# Patient Record
Sex: Female | Born: 1985 | Race: White | Hispanic: No | Marital: Married | Smoking: Never smoker
Health system: Southern US, Community
[De-identification: ages and names within clinical notes are randomized; demographics above are authoritative.]

## PROBLEM LIST (undated history)

## (undated) DIAGNOSIS — K811 Chronic cholecystitis: Secondary | ICD-10-CM

## (undated) DIAGNOSIS — R51 Headache: Secondary | ICD-10-CM

## (undated) DIAGNOSIS — K219 Gastro-esophageal reflux disease without esophagitis: Secondary | ICD-10-CM

## (undated) DIAGNOSIS — Z789 Other specified health status: Secondary | ICD-10-CM

## (undated) DIAGNOSIS — Z8489 Family history of other specified conditions: Secondary | ICD-10-CM

## (undated) DIAGNOSIS — R112 Nausea with vomiting, unspecified: Secondary | ICD-10-CM

## (undated) DIAGNOSIS — R519 Headache, unspecified: Secondary | ICD-10-CM

## (undated) DIAGNOSIS — Z87442 Personal history of urinary calculi: Secondary | ICD-10-CM

## (undated) DIAGNOSIS — K8020 Calculus of gallbladder without cholecystitis without obstruction: Secondary | ICD-10-CM

## (undated) DIAGNOSIS — Z9889 Other specified postprocedural states: Secondary | ICD-10-CM

## (undated) HISTORY — PX: WISDOM TOOTH EXTRACTION: SHX21

## (undated) HISTORY — PX: NO PAST SURGERIES: SHX2092

## (undated) HISTORY — PX: UPPER GI ENDOSCOPY: SHX6162

---

## 2016-02-12 LAB — OB RESULTS CONSOLE ANTIBODY SCREEN: ANTIBODY SCREEN: NEGATIVE

## 2016-02-12 LAB — OB RESULTS CONSOLE HEPATITIS B SURFACE ANTIGEN: Hepatitis B Surface Ag: NEGATIVE

## 2016-02-12 LAB — OB RESULTS CONSOLE ABO/RH: RH Type: POSITIVE

## 2016-02-12 LAB — OB RESULTS CONSOLE GC/CHLAMYDIA
CHLAMYDIA, DNA PROBE: NEGATIVE
GC PROBE AMP, GENITAL: NEGATIVE

## 2016-02-12 LAB — OB RESULTS CONSOLE RUBELLA ANTIBODY, IGM: Rubella: IMMUNE

## 2016-02-12 LAB — OB RESULTS CONSOLE RPR
RPR: REACTIVE
RPR: REACTIVE

## 2016-02-12 LAB — OB RESULTS CONSOLE HIV ANTIBODY (ROUTINE TESTING): HIV: NONREACTIVE

## 2016-08-02 NOTE — L&D Delivery Note (Signed)
Delivery Note At 6:15 PM a viable female was delivered via Vaginal, Spontaneous Delivery (Presentation: vtx; ROA ).  APGAR: 9, 9; weight pending.   Placenta status: spontaneous, intact.  Cord:  with the following complications: nuchal x 1 reduced.   Anesthesia:  Epidural Episiotomy: None Lacerations: 2nd degree Suture Repair: 3.0 vicryl rapide Est. Blood Loss (mL): 200  Mom to postpartum.  Baby to Couplet care / Skin to Skin.  Nalleli Largent D 09/05/2016, 6:32 PM

## 2016-08-05 LAB — OB RESULTS CONSOLE GBS
STREP GROUP B AG: NEGATIVE
STREP GROUP B AG: NEGATIVE

## 2016-09-05 ENCOUNTER — Inpatient Hospital Stay (HOSPITAL_COMMUNITY): Payer: Managed Care, Other (non HMO) | Admitting: Anesthesiology

## 2016-09-05 ENCOUNTER — Inpatient Hospital Stay (HOSPITAL_COMMUNITY)
Admission: AD | Admit: 2016-09-05 | Discharge: 2016-09-07 | DRG: 775 | Disposition: A | Discharge status: Home / Self Care | Payer: Managed Care, Other (non HMO) | Source: Ambulatory Visit | Attending: Obstetrics and Gynecology | Admitting: Obstetrics and Gynecology

## 2016-09-05 ENCOUNTER — Encounter (HOSPITAL_COMMUNITY): Payer: Self-pay

## 2016-09-05 DIAGNOSIS — Z6830 Body mass index (BMI) 30.0-30.9, adult: Secondary | ICD-10-CM

## 2016-09-05 DIAGNOSIS — O99214 Obesity complicating childbirth: Secondary | ICD-10-CM | POA: Diagnosis present

## 2016-09-05 DIAGNOSIS — Z3A39 39 weeks gestation of pregnancy: Secondary | ICD-10-CM | POA: Diagnosis not present

## 2016-09-05 DIAGNOSIS — E669 Obesity, unspecified: Secondary | ICD-10-CM | POA: Diagnosis present

## 2016-09-05 DIAGNOSIS — Z3493 Encounter for supervision of normal pregnancy, unspecified, third trimester: Secondary | ICD-10-CM

## 2016-09-05 HISTORY — DX: Other specified health status: Z78.9

## 2016-09-05 LAB — CBC
HCT: 40.6 % (ref 36.0–46.0)
HEMOGLOBIN: 14.2 g/dL (ref 12.0–15.0)
MCH: 31.8 pg (ref 26.0–34.0)
MCHC: 35 g/dL (ref 30.0–36.0)
MCV: 91 fL (ref 78.0–100.0)
PLATELETS: 230 10*3/uL (ref 150–400)
RBC: 4.46 MIL/uL (ref 3.87–5.11)
RDW: 14.1 % (ref 11.5–15.5)
WBC: 9.6 10*3/uL (ref 4.0–10.5)

## 2016-09-05 LAB — TYPE AND SCREEN
ABO/RH(D): A POS
ANTIBODY SCREEN: NEGATIVE

## 2016-09-05 LAB — ABO/RH: ABO/RH(D): A POS

## 2016-09-05 MED ORDER — FENTANYL 2.5 MCG/ML BUPIVACAINE 1/10 % EPIDURAL INFUSION (WH - ANES)
14.0000 mL/h | INTRAMUSCULAR | Status: DC | PRN
  Administered 2016-09-05: 14 mL/h via EPIDURAL
  Filled 2016-09-05: qty 100

## 2016-09-05 MED ORDER — OXYCODONE-ACETAMINOPHEN 5-325 MG PO TABS: 2.0000 | ORAL_TABLET | ORAL | Status: DC | PRN

## 2016-09-05 MED ORDER — OXYCODONE-ACETAMINOPHEN 5-325 MG PO TABS: 1.0000 | ORAL_TABLET | ORAL | Status: DC | PRN

## 2016-09-05 MED ORDER — PHENYLEPHRINE 40 MCG/ML (10ML) SYRINGE FOR IV PUSH (FOR BLOOD PRESSURE SUPPORT)
80.0000 ug | PREFILLED_SYRINGE | INTRAVENOUS | Status: DC | PRN
  Filled 2016-09-05: qty 5
  Filled 2016-09-05: qty 10

## 2016-09-05 MED ORDER — SENNOSIDES-DOCUSATE SODIUM 8.6-50 MG PO TABS
2.0000 | ORAL_TABLET | ORAL | Status: DC
  Administered 2016-09-05 – 2016-09-07 (×2): 2 via ORAL
  Filled 2016-09-05 (×2): qty 2

## 2016-09-05 MED ORDER — SIMETHICONE 80 MG PO CHEW: 80.0000 mg | CHEWABLE_TABLET | ORAL | Status: DC | PRN

## 2016-09-05 MED ORDER — LACTATED RINGERS IV SOLN: 500.0000 mL | Freq: Once | INTRAVENOUS | Status: DC

## 2016-09-05 MED ORDER — COCONUT OIL OIL
1.0000 "application " | TOPICAL_OIL | Status: DC | PRN
  Filled 2016-09-05: qty 120

## 2016-09-05 MED ORDER — METHYLERGONOVINE MALEATE 0.2 MG PO TABS: 0.2000 mg | ORAL_TABLET | ORAL | Status: DC | PRN

## 2016-09-05 MED ORDER — DIPHENHYDRAMINE HCL 25 MG PO CAPS: 25.0000 mg | ORAL_CAPSULE | Freq: Four times a day (QID) | ORAL | Status: DC | PRN

## 2016-09-05 MED ORDER — LIDOCAINE HCL (PF) 1 % IJ SOLN
INTRAMUSCULAR | Status: DC | PRN
  Administered 2016-09-05: 3 mL via EPIDURAL
  Administered 2016-09-05: 5 mL via EPIDURAL
  Administered 2016-09-05: 2 mL via EPIDURAL

## 2016-09-05 MED ORDER — OXYTOCIN BOLUS FROM INFUSION
500.0000 mL | Freq: Once | INTRAVENOUS | Status: AC
  Administered 2016-09-05: 500 mL via INTRAVENOUS

## 2016-09-05 MED ORDER — ACETAMINOPHEN 325 MG PO TABS
650.0000 mg | ORAL_TABLET | ORAL | Status: DC | PRN
  Administered 2016-09-05 – 2016-09-07 (×7): 650 mg via ORAL
  Filled 2016-09-05 (×7): qty 2

## 2016-09-05 MED ORDER — MEASLES, MUMPS & RUBELLA VAC ~~LOC~~ INJ
0.5000 mL | INJECTION | Freq: Once | SUBCUTANEOUS | Status: DC
  Filled 2016-09-05: qty 0.5

## 2016-09-05 MED ORDER — ZOLPIDEM TARTRATE 5 MG PO TABS: 5.0000 mg | ORAL_TABLET | Freq: Every evening | ORAL | Status: DC | PRN

## 2016-09-05 MED ORDER — TETANUS-DIPHTH-ACELL PERTUSSIS 5-2.5-18.5 LF-MCG/0.5 IM SUSP: 0.5000 mL | Freq: Once | INTRAMUSCULAR | Status: DC

## 2016-09-05 MED ORDER — LIDOCAINE HCL (PF) 1 % IJ SOLN
30.0000 mL | INTRAMUSCULAR | Status: DC | PRN
  Filled 2016-09-05: qty 30

## 2016-09-05 MED ORDER — EPHEDRINE 5 MG/ML INJ
10.0000 mg | INTRAVENOUS | Status: DC | PRN
  Filled 2016-09-05: qty 4

## 2016-09-05 MED ORDER — BENZOCAINE-MENTHOL 20-0.5 % EX AERO
1.0000 "application " | INHALATION_SPRAY | CUTANEOUS | Status: DC | PRN
  Administered 2016-09-05: 1 via TOPICAL
  Filled 2016-09-05: qty 56

## 2016-09-05 MED ORDER — ONDANSETRON HCL 4 MG/2ML IJ SOLN: 4.0000 mg | INTRAMUSCULAR | Status: DC | PRN

## 2016-09-05 MED ORDER — OXYCODONE HCL 5 MG PO TABS: 5.0000 mg | ORAL_TABLET | ORAL | Status: DC | PRN

## 2016-09-05 MED ORDER — IBUPROFEN 600 MG PO TABS: 600.0000 mg | ORAL_TABLET | Freq: Four times a day (QID) | ORAL | Status: DC

## 2016-09-05 MED ORDER — PRENATAL MULTIVITAMIN CH
1.0000 | ORAL_TABLET | Freq: Every day | ORAL | Status: DC
  Administered 2016-09-06 – 2016-09-07 (×2): 1 via ORAL
  Filled 2016-09-05 (×2): qty 1

## 2016-09-05 MED ORDER — SOD CITRATE-CITRIC ACID 500-334 MG/5ML PO SOLN: 30.0000 mL | ORAL | Status: DC | PRN

## 2016-09-05 MED ORDER — ONDANSETRON HCL 4 MG PO TABS: 4.0000 mg | ORAL_TABLET | ORAL | Status: DC | PRN

## 2016-09-05 MED ORDER — METHYLERGONOVINE MALEATE 0.2 MG/ML IJ SOLN: 0.2000 mg | INTRAMUSCULAR | Status: DC | PRN

## 2016-09-05 MED ORDER — ONDANSETRON HCL 4 MG/2ML IJ SOLN: 4.0000 mg | Freq: Four times a day (QID) | INTRAMUSCULAR | Status: DC | PRN

## 2016-09-05 MED ORDER — BUTORPHANOL TARTRATE 1 MG/ML IJ SOLN: 1.0000 mg | INTRAMUSCULAR | Status: DC | PRN

## 2016-09-05 MED ORDER — DIBUCAINE 1 % RE OINT
1.0000 "application " | TOPICAL_OINTMENT | RECTAL | Status: DC | PRN
  Filled 2016-09-05: qty 28

## 2016-09-05 MED ORDER — LACTATED RINGERS IV SOLN
INTRAVENOUS | Status: DC
  Administered 2016-09-05: 16:00:00 via INTRAVENOUS

## 2016-09-05 MED ORDER — OXYCODONE HCL 5 MG PO TABS: 10.0000 mg | ORAL_TABLET | ORAL | Status: DC | PRN

## 2016-09-05 MED ORDER — WITCH HAZEL-GLYCERIN EX PADS: 1.0000 "application " | MEDICATED_PAD | CUTANEOUS | Status: DC | PRN

## 2016-09-05 MED ORDER — DIPHENHYDRAMINE HCL 50 MG/ML IJ SOLN: 12.5000 mg | INTRAMUSCULAR | Status: DC | PRN

## 2016-09-05 MED ORDER — MAGNESIUM HYDROXIDE 400 MG/5ML PO SUSP: 30.0000 mL | ORAL | Status: DC | PRN

## 2016-09-05 MED ORDER — OXYTOCIN 40 UNITS IN LACTATED RINGERS INFUSION - SIMPLE MED
2.5000 [IU]/h | INTRAVENOUS | Status: DC
  Filled 2016-09-05: qty 1000

## 2016-09-05 MED ORDER — ACETAMINOPHEN 325 MG PO TABS: 650.0000 mg | ORAL_TABLET | ORAL | Status: DC | PRN

## 2016-09-05 MED ORDER — LACTATED RINGERS IV SOLN: 500.0000 mL | INTRAVENOUS | Status: DC | PRN

## 2016-09-05 NOTE — MAU Note (Signed)
Pt C/O uc's since yesterday, membranes were stripped on Friday, SVE 2 cm's.  Contractions have become more regular since 1200 today, small amount of bleeding, denies LOF.

## 2016-09-05 NOTE — Anesthesia Preprocedure Evaluation (Signed)
Anesthesia Evaluation  Patient identified by MRN, date of birth, ID band Patient awake    Reviewed: Allergy & Precautions, NPO status , Patient's Chart, lab work & pertinent test results  Airway Mallampati: II  TM Distance: >3 FB Neck ROM: Full    Dental  (+) Teeth Intact, Dental Advisory Given   Pulmonary neg pulmonary ROS,    Pulmonary exam normal breath sounds clear to auscultation       Cardiovascular negative cardio ROS Normal cardiovascular exam Rhythm:Regular Rate:Normal     Neuro/Psych negative neurological ROS     GI/Hepatic negative GI ROS, Neg liver ROS,   Endo/Other  Obesity   Renal/GU negative Renal ROS     Musculoskeletal negative musculoskeletal ROS (+)   Abdominal   Peds  Hematology negative hematology ROS (+) Plt 230k   Anesthesia Other Findings Day of surgery medications reviewed with the patient.  Reproductive/Obstetrics (+) Pregnancy                             Anesthesia Physical Anesthesia Plan  ASA: II  Anesthesia Plan: Epidural   Post-op Pain Management:    Induction:   Airway Management Planned:   Additional Equipment:   Intra-op Plan:   Post-operative Plan:   Informed Consent: I have reviewed the patients History and Physical, chart, labs and discussed the procedure including the risks, benefits and alternatives for the proposed anesthesia with the patient or authorized representative who has indicated his/her understanding and acceptance.   Dental advisory given  Plan Discussed with:   Anesthesia Plan Comments: (Patient identified. Risks/Benefits/Options discussed with patient including but not limited to bleeding, infection, nerve damage, paralysis, failed block, incomplete pain control, headache, blood pressure changes, nausea, vomiting, reactions to medication both or allergic, itching and postpartum back pain. Confirmed with bedside nurse the  patient's most recent platelet count. Confirmed with patient that they are not currently taking any anticoagulation, have any bleeding history or any family history of bleeding disorders. Patient expressed understanding and wished to proceed. All questions were answered. )        Anesthesia Quick Evaluation

## 2016-09-05 NOTE — H&P (Signed)
Margaret Powell is a 31 y.o. female, G4 P1021, EGA 39+ weeks with EDC 2-6 presenting for ctx.  Eval in MAU with reg ctx, VE 4 cm and baby low.  Prenatal care essentially uncomplicated, normal fetal ECHO since last child had VSD.  OB History    Gravida Para Term Preterm AB Living   4 1     2 1    SAB TAB Ectopic Multiple Live Births   2       1     Past Medical History:  Diagnosis Date  . Medical history non-contributory    Past Surgical History:  Procedure Laterality Date  . NO PAST SURGERIES     Family History: family history is not on file. Social History:  reports that she has never smoked. She has never used smokeless tobacco. She reports that she does not drink alcohol or use drugs.     Maternal Diabetes: No Genetic Screening: Declined Maternal Ultrasounds/Referrals: Normal Fetal Ultrasounds or other Referrals:  Fetal echo Maternal Substance Abuse:  No Significant Maternal Medications:  None Significant Maternal Lab Results:  Lab values include: Group B Strep negative Other Comments:  None  Review of Systems  Respiratory: Negative.   Cardiovascular: Negative.    Maternal Medical History:  Reason for admission: Contractions.   Contractions: Perceived severity is moderate.    Fetal activity: Perceived fetal activity is normal.    Prenatal complications: no prenatal complications  VE-Rim/C/+1, vtx, AROM clear Dilation: 5 Effacement (%): 80 Station: -1 Exam by:: k fields, rn Blood pressure 118/62, pulse 76, temperature 97.9 F (36.6 C), temperature source Oral, resp. rate 18, height 5\' 4"  (1.626 m), weight 79.4 kg (175 lb), SpO2 100 %. Maternal Exam:  Uterine Assessment: Contraction strength is moderate.  Contraction frequency is regular.   Abdomen: Patient reports no abdominal tenderness. Estimated fetal weight is 7 1/2 lbs.   Fetal presentation: vertex  Introitus: Normal vulva. Normal vagina.  Amniotic fluid character: clear.  Pelvis: adequate for  delivery.   Cervix: Cervix evaluated by digital exam.     Fetal Exam Fetal Monitor Review: Mode: ultrasound.   Baseline rate: 120.  Variability: moderate (6-25 bpm).   Pattern: accelerations present and no decelerations.    Fetal State Assessment: Category I - tracings are normal.     Physical Exam  Vitals reviewed. Constitutional: She appears well-developed and well-nourished.  Cardiovascular: Normal rate and regular rhythm.   Respiratory: Effort normal. No respiratory distress.  GI: Soft.    Prenatal labs: ABO, Rh: --/--/A POS (02/04 1556) Antibody: NEG (02/04 1556) Rubella: Immune (07/13 0000) RPR: Reactive, Reactive (07/13 0000)  HBsAg: Negative (07/13 0000)  HIV: Non-reactive (07/13 0000)  GBS: Negative, Negative (01/04 0000)   Assessment/Plan: IUP at 39+ weeks in active labor.  Anticipate SVD.   Margaret Powell D 09/05/2016, 5:57 PM

## 2016-09-05 NOTE — Anesthesia Procedure Notes (Signed)
Epidural Patient location during procedure: OB Start time: 09/05/2016 4:36 PM End time: 09/05/2016 4:41 PM  Staffing Anesthesiologist: Cecile HearingURK, Toshiyuki Fredell EDWARD Performed: anesthesiologist   Preanesthetic Checklist Completed: patient identified, pre-op evaluation, timeout performed, IV checked, risks and benefits discussed and monitors and equipment checked  Epidural Patient position: sitting Prep: DuraPrep Patient monitoring: blood pressure and continuous pulse ox Approach: midline Location: L3-L4 Injection technique: LOR air  Needle:  Needle type: Tuohy  Needle gauge: 17 G Needle length: 9 cm Needle insertion depth: 4 cm Catheter size: 19 Gauge Catheter at skin depth: 9 cm Test dose: negative and Other (1% Lidocaine)  Additional Notes Patient identified.  Risk benefits discussed including failed block, incomplete pain control, headache, nerve damage, paralysis, blood pressure changes, nausea, vomiting, reactions to medication both toxic or allergic, and postpartum back pain.  Patient expressed understanding and wished to proceed.  All questions were answered.  Sterile technique used throughout procedure and epidural site dressed with sterile barrier dressing. No paresthesia or other complications noted. The patient did not experience any signs of intravascular injection such as tinnitus or metallic taste in mouth nor signs of intrathecal spread such as rapid motor block. Please see nursing notes for vital signs. Reason for block:procedure for pain

## 2016-09-06 NOTE — Lactation Note (Signed)
This note was copied from a baby's chart. Lactation Consultation Note  Patient Name: Margaret Powell ZOXWR'UToday's Date: 09/06/2016 Reason for consult: Follow-up assessment  Asked to assist with a feeding.  Baby 16 hrs old.  Mom states baby had just fed for 30 minutes.  Baby in crib swaddled and cueing.  Offered to assist with positioning and latch on 2nd breast.  Baby positioned in football hold.  Teaching done.  Mom has large, heavy breasts, with large diameter nipples.  Hand expression reviewed.  Colostrum expressed easily.  Baby latched onto areola after a couple attempts, and checked lower lip.  Showed FOB how to assess lower lip, and tug on chin to uncurl lip for better seal.  Lots of basics reviewed.  Mom stated the latch felt comfortable.  A few swallows identified.   Encouraged STS and cue based feedings.  Discussed cluster feedings.  Encouraged to ask for assistance as needed.     Consult Status Consult Status: Follow-up Date: 09/07/16 Follow-up type: In-patient    Judee ClaraSmith, Braydan Marriott E 09/06/2016, 10:29 AM

## 2016-09-06 NOTE — Lactation Note (Signed)
This note was copied from a baby's chart. Lactation Consultation Note Mom's 2nd child. Didn't BF her first child. Stated couldn't latch. Mom has large pendulum breast. Encouraged to elevate w/cloth for breast support. Mom has large everted nipple. They are purplish/dark colored. Mom stated they are usually that color.  Sitting mom upright to assist in latching. Baby started spitting up large emesis. Discussed positions, feeding habits of newborns, cluster feeding, I&O. Mom encouraged to feed baby 8-12 times/24 hours and with feeding cues. Encouraged mom to call for latch assistance if needed.  WH/LC brochure given w/resources, support groups and LC services. Patient Name: Margaret Powell ZOXWR'UToday's Date: 09/06/2016 Reason for consult: Initial assessment   Maternal Data Has patient been taught Hand Expression?: Yes Does the patient have breastfeeding experience prior to this delivery?: No  Feeding Feeding Type: Breast Fed  LATCH Score/Interventions       Type of Nipple: Everted at rest and after stimulation  Comfort (Breast/Nipple): Soft / non-tender     Intervention(s): Breastfeeding basics reviewed;Support Pillows;Position options;Skin to skin     Lactation Tools Discussed/Used     Consult Status Consult Status: Follow-up Date: 09/06/16 Follow-up type: In-patient    [name], Diamond NickelLAURA G 09/06/2016, 7:15 AM

## 2016-09-06 NOTE — Progress Notes (Signed)
Post Partum Day 1  no complaints, up ad lib and tolerating PO , baby spitting a lot Objective: Blood pressure 113/77, pulse 82, temperature 98 F (36.7 C), temperature source Oral, resp. rate 16, height 5\' 4"  (1.626 m), weight 79.4 kg (175 lb), SpO2 100 %, unknown if currently breastfeeding.  Physical Exam:  General: alert and cooperative Lochia: appropriate Uterine Fundus: firm    Recent Labs  09/05/16 1556  HGB 14.2  HCT 40.6    Assessment/Plan: Plan for discharge tomorrow   LOS: 1 day   Arianny Pun W 09/06/2016, 9:44 AM

## 2016-09-06 NOTE — Lactation Note (Signed)
This note was copied from a baby's chart. Lactation Consultation Note  Patient Name: Girl Inocencio HomesLissette Tencza WUJWJ'XToday's Date: 09/06/2016 Reason for consult: Follow-up assessment Baby at 26 hr of life. Mom is reporting bilateral nipple pain. She has large compressible breast with large nipples. The R nipple looks almost bifurcated with a bruised surface and the areola is red. Given comfort gels. Had mom reposition the baby so that the baby was at nipple level. Mom waited for a wide open mouth and lathed baby deeply. Reminded mom to relax when she was bf. She reported this bf felt better than the others. Reviewed baby behavior, nipple care, and breast changes. Parents are aware of lactation services and will call as needed.   Maternal Data    Feeding Feeding Type: Breast Fed Length of feed: 30 min  LATCH Score/Interventions Latch: Grasps breast easily, tongue down, lips flanged, rhythmical sucking. Intervention(s): Adjust position;Breast compression  Audible Swallowing: A few with stimulation Intervention(s): Skin to skin;Hand expression;Alternate breast massage  Type of Nipple: Everted at rest and after stimulation  Comfort (Breast/Nipple): Filling, red/small blisters or bruises, mild/mod discomfort  Problem noted: Mild/Moderate discomfort;Cracked, bleeding, blisters, bruises Interventions  (Cracked/bleeding/bruising/blister): Expressed breast milk to nipple Interventions (Mild/moderate discomfort): Comfort gels (coconut oil)  Hold (Positioning): Assistance needed to correctly position infant at breast and maintain latch. Intervention(s): Position options;Support Pillows  LATCH Score: 7  Lactation Tools Discussed/Used     Consult Status Consult Status: Follow-up Date: 09/07/16 Follow-up type: In-patient    Rulon Eisenmengerlizabeth E Chennel Olivos 09/06/2016, 9:12 PM

## 2016-09-06 NOTE — Anesthesia Postprocedure Evaluation (Signed)
Anesthesia Post Note  Patient: Margaret Powell  Procedure(s) Performed: * No procedures listed *  Patient location during evaluation: Mother Baby Anesthesia Type: Epidural Level of consciousness: awake Pain management: satisfactory to patient Vital Signs Assessment: post-procedure vital signs reviewed and stable Respiratory status: spontaneous breathing Cardiovascular status: stable Anesthetic complications: no        Last Vitals:  Vitals:   09/06/16 0130 09/06/16 0500  BP: 109/68 113/77  Pulse: 82 82  Resp: 16 16  Temp: 36.8 C 36.7 C    Last Pain:  Vitals:   09/06/16 0542  TempSrc:   PainSc: 2    Pain Goal: Patients Stated Pain Goal: 7 (09/05/16 1554)               Cephus ShellingBURGER,Oluchi Pucci

## 2016-09-07 LAB — RPR: RPR: REACTIVE — AB

## 2016-09-07 LAB — RPR, QUANT+TP ABS (REFLEX)
Rapid Plasma Reagin, Quant: 1:8 {titer} — ABNORMAL HIGH
TREPONEMA PALLIDUM AB: NEGATIVE

## 2016-09-07 MED ORDER — PRENATAL 27-0.8 MG PO TABS: 1.0000 | ORAL_TABLET | Freq: Every day | ORAL | 3 refills | Status: DC

## 2016-09-07 NOTE — Plan of Care (Signed)
Problem: Health Behavior/Discharge Planning: Goal: Ability to manage health-related needs will improve Outcome: Completed/Met Date Met: 09/07/16 Discharge education and paperwork discussed.  Reasons to call the MD reviewed.  Pt verbalizes understanding.

## 2016-09-07 NOTE — Progress Notes (Addendum)
Post Partum Day 2 Subjective: no complaints, up ad lib, voiding, tolerating PO and nl lochia, pain controlled  Objective: Blood pressure 122/79, pulse 62, temperature 97.9 F (36.6 C), temperature source Oral, resp. rate 17, height 5\' 4"  (1.626 m), weight 79.4 kg (175 lb), SpO2 100 %, unknown if currently breastfeeding.  Physical Exam:  General: alert and no distress Lochia: appropriate Uterine Fundus: firm   Recent Labs  09/05/16 1556  HGB 14.2  HCT 40.6    Assessment/Plan: Plan for discharge tomorrow, Breastfeeding and Lactation consult.  D/C home with PNV.  F/u 6 weeks.     LOS: 2 days   Bovard-Stuckert, Anhthu Perdew 09/07/2016, 8:44 AM

## 2016-09-07 NOTE — Discharge Summary (Signed)
    OB Discharge Summary     Patient Name: Margaret Powell DOB: 1986/07/24 MRN: [id number]  Date of admission: 09/05/2016 Delivering MD: Jackelyn KnifeMEISINGER, TODD   Date of discharge: 09/07/2016  Admitting diagnosis: 39 WKS, LABOR Intrauterine pregnancy: 3483w5d     Secondary diagnosis:  Active Problems:   Normal pregnancy in third trimester   SVD (spontaneous vaginal delivery)  Additional problems: N/A     Discharge diagnosis: Term Pregnancy Delivered                                                                                                Post partum procedures:N/A  Augmentation: AROM  Complications: None  Hospital course:  Onset of Labor With Vaginal Delivery     31 y.o. yo [id number] at 7783w5d was admitted in Active Labor on 09/05/2016. Patient had an uncomplicated labor course as follows:  Membrane Rupture Time/Date: 5:52 PM ,09/05/2016   Intrapartum Procedures: Episiotomy: None [1]                                         Lacerations:  2nd degree [3]  Patient had a delivery of a Viable infant. 09/05/2016  Information for the patient's newborn:  Aurelio BrashRodriguez, Girl Britnie [[id number]][[id number]]  Delivery Method: Vaginal, Spontaneous Delivery (Filed from Delivery Summary)    Pateint had an uncomplicated postpartum course.  She is ambulating, tolerating a regular diet, passing flatus, and urinating well. Patient is discharged home in stable condition on 09/07/16.   Physical exam  Vitals:   09/06/16 0130 09/06/16 0500 09/06/16 1812 09/07/16 0553  BP: 109/68 113/77 129/77 122/79  Pulse: 82 82 67 62  Resp: 16 16 18 17   Temp: 98.2 F (36.8 C) 98 F (36.7 C) 98.1 F (36.7 C) 97.9 F (36.6 C)  TempSrc: Oral Oral Oral Oral  SpO2:      Weight:      Height:       General: alert and no distress Lochia: appropriate Uterine Fundus: firm  Lab Results  Component Value Date   WBC 9.6 09/05/2016   HGB 14.2 09/05/2016   HCT 40.6 09/05/2016   MCV 91.0 09/05/2016   PLT 230 09/05/2016   No  flowsheet data found.  Discharge instruction: per After Visit Summary and "Baby and Me Booklet".  After visit meds:  Allergies as of 09/07/2016      Reactions   Nsaids Rash   blisters      Medication List    TAKE these medications   multivitamin-prenatal 27-0.8 MG Tabs tablet Take 1 tablet by mouth daily at 12 noon.       Diet: routine diet  Activity: Advance as tolerated. Pelvic rest for 6 weeks.   Outpatient follow up:6 weeks Follow up Appt:No future appointments. Follow up Visit:No Follow-up on file.  Postpartum contraception: Undecided  Newborn Data: Live born female  Birth Weight: 6 lb 9.6 oz (2994 g) APGAR: 9, 9  Baby Feeding: Breast Disposition:home with mother   09/07/2016 Margaret Powell, Margaret Fazzino, MD

## 2016-09-07 NOTE — Lactation Note (Signed)
This note was copied from a baby's chart. Lactation Consultation Note; Mom reports nipples are sore- right one is the worse. Some bruising and blisters noted. Has comfort gels. Baby waking in bassinet. Offered assist with latch and mom agreeable. Mom has been using football hold for every feeding. Suggested using cross cradle hold/ Assisted mom with positioning. Baby took a couple of attempts, I untucked bottom lip and mom reports that feels better. Mom reports breasts are feeling a little heavier this morning. Reviewed engorgement prevention and treatment. Has Medela pump for home. Offered OP appointment and mom plans to make one but wants to make Ped appointment first. She will call for appointment. Reviewed our phone number and [facility] as other resources for support after DC. No further questions at present. To call prn  Patient Name: Margaret Powell HomesLissette Troop ZOXWR'UToday's Date: 09/07/2016 Reason for consult: Follow-up assessment   Maternal Data Formula Feeding for Exclusion: No Has patient been taught Hand Expression?: Yes Does the patient have breastfeeding experience prior to this delivery?: Yes  Feeding Feeding Type: Breast Fed Length of feed: 10 min  LATCH Score/Interventions Latch: Grasps breast easily, tongue down, lips flanged, rhythmical sucking. Intervention(s): Assist with latch;Adjust position;Breast compression  Audible Swallowing: A few with stimulation Intervention(s): Skin to skin;Hand expression  Type of Nipple: Everted at rest and after stimulation  Comfort (Breast/Nipple): Filling, red/small blisters or bruises, mild/mod discomfort  Problem noted: Mild/Moderate discomfort Interventions  (Cracked/bleeding/bruising/blister): Expressed breast milk to nipple Interventions (Mild/moderate discomfort): Comfort gels;Hand expression  Hold (Positioning): Assistance needed to correctly position infant at breast and maintain latch. Intervention(s): Breastfeeding basics reviewed;Support  Pillows;Position options  LATCH Score: 7  Lactation Tools Discussed/Used WIC Program: No   Consult Status Consult Status: Complete    Pamelia HoitWeeks, Marzella Miracle D 09/07/2016, 9:29 AM

## 2016-09-09 ENCOUNTER — Ambulatory Visit (HOSPITAL_COMMUNITY)
Admission: RE | Admit: 2016-09-09 | Discharge: 2016-09-09 | Disposition: A | Discharge status: Home / Self Care | Payer: Managed Care, Other (non HMO) | Source: Ambulatory Visit | Attending: Obstetrics and Gynecology | Admitting: Obstetrics and Gynecology

## 2016-09-09 DIAGNOSIS — Z029 Encounter for administrative examinations, unspecified: Secondary | ICD-10-CM | POA: Diagnosis not present

## 2016-09-09 NOTE — Lactation Note (Signed)
Lactation Consult; Weight today 2772 g 6 # 1.8 oz. Margaret Powell had large brown stool here before weight check. Mom fed her about 45 min before coming to appointment because she was so hungry, Reports she fed a lot through the night. Mom here today because her nipples are very sore. She has large nipples, both pink and some cracking noted on right nipple. Both breasts very full. Reports her milk started coming in yesterday. Baby not very hungry. Did latch with assist and mom reports that feels better after I untucked bottom lip. Reviewed engorgement prevention and treatment. Encouraged ice packs to breasts, frequent nursing and pumping for comfort. She did not bring all of her pump pieces with her.I changed her to # 27 flanges and she reports that feels much better.  Pumped one breast and will go home and pump other breast. Reviewed milk storage. Offered another OP appointment and mom agreeable. Made for Thursday 2/15 at 9 am. Mom very tired, encouraged napping whenever baby naps. Mom going back to work at 12 Margaret Powell and not sure how long she will be able to pump at work. Asking about weaning and changing to formula. Reviewed with parents. No questions at present. To call prn  Mother's reason for visit:  Sore nipples Visit Type:  Feeding assessment  Consult:  Initial Lactation Consultant:  Audry Riles D  ________________________________________________________________________ Margaret Powell Name:  Margaret Powell Date of Birth:  09/05/2016 Pediatrician:  Maisie Fus Gender:  female Gestational Age: [redacted]w[redacted]d (At Birth) Birth Weight:  6 lb 9.6 oz (2994 g) Weight at Discharge:  Weight: 6 lb 3.1 oz (2810 g)               Date of Discharge:  09/07/2016      Filed Weights   09/05/16 1815 09/06/16 0054 09/07/16 0000  Weight: 6 lb 9.6 oz (2994 g) 6 lb 6.7 oz (2910 g) 6 lb 3.1 oz (2810 g)      ________________________________________________________________________  Mother's Name: Margaret Powell Type of delivery:   vag Breastfeeding Experience:  P2 did not breast feed first for long, very sore nipples and changed to formula ________________________________________________________________________  Breastfeeding History (Post Discharge)  Frequency of breastfeeding:  Cluster fed through the night, q 2-3 during the day Duration of feeding:  30 + min    Pumping  Type of pump:  Medela pump in style Frequency:  Twice yesterday Volume:  30 ml  Infant Intake and Output Assessment  Voids:  8+ in 24 hrs.  Color:  Clear yellow had 2 wet and large stool while here for appointment Stools:  7+ in 24 hrs.  Color:  Brown  ________________________________________________________________________  Maternal Breast Assessment  Breast:  Full Nipple:  Erect, Reddened and Cracked  _______________________________________________________________________ Feeding Assessment/Evaluation  Initial feeding assessment:  Infant's oral assessment:  WNL  Positioning:  Football Right breast  LATCH documentation:  Latch:  2 = Grasps breast easily, tongue down, lips flanged, rhythmical sucking.  Audible swallowing:  1 = A few with stimulation  Type of nipple:  2 = Everted at rest and after stimulation  Comfort (Breast/Nipple):  0 = Engorged, cracked, bleeding, large blisters, severe discomfort  Hold (Positioning):  1 = Assistance needed to correctly position infant at breast and maintain latch  LATCH score:  6  Attached assessment:  Deep  Lips flanged:  Yes.    Lips untucked:  Yes.    Suck assessment:  Nonnutritive   Pre-feed weight:  2772 g  6 # 1.8 oz Post-feed  weight:  2782 g  6# 2.1 oz Amount transferred:  10 ml Amount supplemented:  0 ml    Total amount pumped post feed:  R 15 ml  Mom only brought once setup with her, will pump other breast when she gets home   Total amount transferred:  10 ml Total supplement given:  0 ml

## 2016-09-16 ENCOUNTER — Ambulatory Visit (HOSPITAL_COMMUNITY)
Admission: RE | Admit: 2016-09-16 | Discharge: 2016-09-16 | Disposition: A | Discharge status: Home / Self Care | Payer: Managed Care, Other (non HMO) | Source: Ambulatory Visit | Attending: Obstetrics and Gynecology | Admitting: Obstetrics and Gynecology

## 2016-09-16 ENCOUNTER — Ambulatory Visit (HOSPITAL_COMMUNITY): Payer: Managed Care, Other (non HMO)

## 2016-09-16 DIAGNOSIS — Z029 Encounter for administrative examinations, unspecified: Secondary | ICD-10-CM | POA: Diagnosis not present

## 2016-09-16 NOTE — Lactation Note (Signed)
Lactation Consult - baby girl Margaret Powell , and mom Margaret Powell and grandmother present at [facility] consult for 9 am.  Baby awake and hungry, weight  With clean diaper - 6-6.4 oz , increased gain since this past Tuesday 2/13 at [facility] office  Per mom latches have been painful , even after the O/P appt last week  Baby has been feeding about every 2 hours 20 mins each breast . Initially the pain is at an 8 and once the baby starts swallowing improves to 5 intermittently, not the whole feeding.  Wet diapers - >6 , stools >2-3 yellow in 24 hours  DEBP Medela ( stopped pumping since the last consult - baby seemed to be latching better and the weight was going up. Per mom called Dr. Newt Minion for prescription for "All purpose nipple cream " and the soreness has improved. ( see Breast assessment below )  Please see Lactation Impression , and Lactation plan of Care below.   Mother's reason for visit:  F/U for weight check , and sore nipples  Visit Type:  Feeding assessment  Appointment Notes: F/U visit - SN's, pt confirmed appt.  Consult:  Follow-Up Lactation Consultant:  Margaret Powell  ________________________________________________________________________ Margaret Powell Name:  Margaret Powell Date of Birth:  09/05/2016 Pediatrician:  Dr. Maisie Fus / [facility]  Gender:  female Gestational Age: [redacted]w[redacted]d (At Birth) Birth Weight:  6 lb 9.6 oz (2994 g) Weight at Discharge:  Weight: 6 lb 3.1 oz (2810 g)               Date of Discharge:  09/07/2016      Filed Weights   09/05/16 1815 09/06/16 0054 09/07/16 0000  Weight: 6 lb 9.6 oz (2994 g) 6 lb 6.7 oz (2910 g) 6 lb 3.1 oz (2810 g)  Last weight taken from location outside of [facility]:  2/13 - 6-5 oz     Location:Pediatrician's office Weight today: 6-6.4 oz , 2902 g _____________________________________________________________________  Mother's Name: Margaret Powell Type of delivery:  Vaginal Delivery  Breastfeeding Experience: 2nd baby , 1st baby  breast feeding. ) bottle feed 1st baby  Maternal Medical Conditions:  No risk  Maternal Medications:  PNV , stool softener   ________________________________________________________________________  Breastfeeding History (Post - see above note  Discharge)________________________________________________________________________  Maternal Breast Assessment  Breast:  Full Nipple:  Erect Pain level:  5 - 8 with latching  Pain interventions:  Comfort gels and Expressed breast milk  _______________________________________________________________________ Feeding Assessment/Evaluation  Initial feeding assessment:  Infant's oral assessment:  High palate . Labial frenulum close to gum line , but stretches well with exam and when latched. Baby has a small mouth   Positioning:  Cross cradle Right breast  LATCH documentation:  Latch:  2 = Grasps breast easily, tongue down, lips flanged, rhythmical sucking.  Audible swallowing:  2 = Spontaneous and intermittent  Type of nipple:  2 = Everted at rest and after stimulation  Comfort (Breast/Nipple):  1 = Filling, red/small blisters or bruises, mild/mod discomfort  Hold (Positioning):  1 = Assistance needed to correctly position infant at breast and maintain latch  LATCH score: 8  Attached assessment:  Shallow @ 1st   Lips flanged:  No.  Lips untucked:  Yes.    Suck assessment:  Nutritive  Tools:  Comfort gels ( for after feeding )  Instructed on use and cleaning of tool:  Yes.    Pre-feed weight: 2902 g , 6-6.4 oz  Post-feed weight:  2958  g , 6-8 oz  Amount transferred: 56 ml  Amount supplemented:  None   Additional Feeding Assessment -   Positioning:  Football Left breast  LATCH documentation:  Latch:  2 = Grasps breast easily, tongue down, lips flanged, rhythmical sucking.  Audible swallowing:  2 = Spontaneous and intermittent  Type of nipple:  2 = Everted at rest and after stimulation  Comfort (Breast/Nipple):  1 = Filling,  red/small blisters or bruises, mild/mod discomfort  Hold (Positioning):  1 = Assistance needed to correctly position infant at breast and maintain latch  LATCH score: 8   Attached assessment:  Deep  Lips flanged:  Yes.    Lips untucked:  Yes.    Suck assessment:  Nutritive  Tools:  None needed for latch   Pre-feed weight: 2958 g , 6-8.3 oz  Post-feed weight: 2970 g , 6-8.8 oz  Amount transferred: 12 ml  Amount supplemented:  None needed  Total amount pumped post feed: not needed   Total amount transferred:   68 ml  Total supplement given:  None needed   Lactation Impression:  Baby Latched both breast with improved comfort for mom  Worked on training the baby to open wide and latch with breast compressions.  Per mom the discomfort improved especially on the 2nd breast.  Baby transferred off 68 ml total both breast - excellent for 1082 weeks old , and was satisfied. Mom has down a great job establishing and protecting her milk supply.  Mom is motivated and plans to breast feed when returning back to work.  Lactation Plan pf Care:  Praised mom for her efforts breast feeding  LC recommended mom to [facility] am or Tuesday 11am  Make smart weight check for next Thursday 2/22 or Friday 2/ 23  Continue to freed with feeding cues and at least every 2-3 hours  Days/ evenings 2- 2- 1/2 hours. Nights by 3 hours until steady weight gain  Latching - work with Kandace BlitzGianna to open wide , aim the nipple towards the roof of her mouth and breast compressions until swallows( consistent pattern )  and comfort achieved. Intermittent breast compressions - enhance milk transfer  Alternate comfort gels with nipple cream ( comfort gels x6 days )  Loraine LericheMark your calendar for 4 weeks post partum for when to start extra post pumping 3 x's day both breast 10 -15 mins . Introduce a bottle ( medium base ) once day - pace feeding (LC discussed ). And pump instead to replace baby not  feeding at the breast.

## 2017-07-12 ENCOUNTER — Ambulatory Visit: Payer: Self-pay | Admitting: Surgery

## 2017-07-12 NOTE — H&P (View-Only) (Signed)
Margaret Powell 07/12/2017 12:15 PM Location: [facility] Patient #: [id number] DOB: 06/23/1986 Married / Language: English / Race: White Female  History of Present Illness (Jalesia Loudenslager C. Mita Vallo MD; 07/12/2017 1:01 PM) The patient is a 31 year old female who presents for evaluation of gall stones. Note for "Gall stones": ` ` ` Patient sent for surgical consultation at the request of Bhupinder Multani, NP  Chief Complaint: Abdominal pain and gallstones. Probable need for cholecystectomy  The patient is a pleasant, young female. Her originally from [place]. Delivered her child here in February 2018. She had episode of abdominal pain radiating to her back and shoulder after eating a [place]. It woke her up at night. Felt very nauseated. Vomited.. Her husband had the same meal without any problems. No one else sick in the family. She notes most of her family has had gallbladder problems needing surgery in the past. She was concerned about the attack. Has had persistent bloating and discomfort when eating. This does not seem like heartburn or reflux. Persisting and concerning. Went to urgent care center. Suspicion for gallbladder etiology. Ultrasound confirmed gallstones. Small right renal calculus but nonobstructing. Surgical consultation requested.  No personal nor family history of GI/colon cancer, inflammatory bowel disease, irritable bowel syndrome, allergy such as Celiac Sprue, dietary/dairy problems, colitis, ulcers nor gastritis. No recent sick contacts/gastroenteritis. No travel outside the country. No changes in diet. No dysphagia to solids or liquids. No significant heartburn or reflux. No hematochezia, hematemesis, coffee ground emesis. No evidence of prior gastric/peptic ulceration. No history of hematuria or pyuria. No history of urinary tract infections or pyelonephritis. No gestational diabetes and other issues. She does not smoke. She is not  diabetic. She can walk several miles without difficulty. Occasional constipation but usually moves her bowels every day or so.  (Review of systems as stated in this history (HPI) or in the review of systems. Otherwise all other 12 point ROS are negative) ` ` ` Result Impression IMPRESSION: 1. Cholelithiasis. 2. Small nonobstructing right renal calculi Electronically Signed by: Dennis Clemens Result Narrative TECHNIQUE: Realtime multiplanar grayscale and limited color Doppler ultrasound of the right upper quadrant was performed. COMPARISON: None. INDICATION: epigastric, + murphy's sign FINDINGS: PANCREAS: No focal abnormalities are identified. Visualization is limited. VASCULATURE: No abdominal aortic aneurysm. Visualized IVC is patent. Portal vein is patent with normal flow direction. HEPATOBILIARY: Liver: Normal. Gallbladder: Cholelithiasis. No gallbladder wall thickening or sonographic Murphy's sign. CBD: Normal. No intrahepatic biliary ductal dilatation. RIGHT KIDNEY: Right kidney is of normal size. No hydronephrosis. No suspicious masses. Normal renal echotexture. Small nonobstructing right renal calculi MISC: N./A. Other Result Information Acute Interface, Incoming Rad Results - 06/29/2017 4:29 PM EST TECHNIQUE: Realtime multiplanar grayscale and limited color Doppler ultrasound of the right upper quadrant was performed. COMPARISON: None. INDICATION: epigastric, + murphy's sign FINDINGS: PANCREAS: No focal abnormalities are identified. Visualization is limited. VASCULATURE: No abdominal aortic aneurysm. Visualized IVC is patent. Portal vein is patent with normal flow direction. HEPATOBILIARY: Liver: Normal. Gallbladder: Cholelithiasis. No gallbladder wall thickening or sonographic Murphy's sign. CBD: Normal. No intrahepatic biliary ductal dilatation. RIGHT KIDNEY: Right kidney is of normal size. No hydronephrosis. No suspicious masses. Normal renal echotexture. Small  nonobstructing right renal calculi MISC: N./A. IMPRESSION: 1. Cholelithiasis. 2. Small nonobstructing right renal calculi   Past Surgical History (Janette Ranson, CMA; 07/12/2017 12:16 PM) No pertinent past surgical history  Diagnostic Studies History (Janette Ranson, CMA; 07/12/2017 12:16 PM) Colonoscopy never Mammogram never Pap Smear 1-5 years ago    Allergies (Janette Ranson, CMA; 07/12/2017 12:17 PM) NSAIDs  Medication History (Janette Ranson, CMA; 07/12/2017 12:17 PM) No Current Medications Medications Reconciled  Social History (Janette Ranson, CMA; 07/12/2017 12:16 PM) Alcohol use Occasional alcohol use. Caffeine use Coffee. No drug use Tobacco use Never smoker.  Family History (Janette Ranson, CMA; 07/12/2017 12:16 PM) Diabetes Mellitus Family Members In General. Hypertension Family Members In General. Migraine Headache Mother.  Pregnancy / Birth History (Janette Ranson, CMA; 07/12/2017 12:16 PM) Age at menarche 9 years. Contraceptive History Intrauterine device. Gravida 4 Irregular periods Length (months) of breastfeeding 3-6 Maternal age 21-25 Para 2  Other Problems (Janette Ranson, CMA; 07/12/2017 12:16 PM) Cholelithiasis Hemorrhoids Kidney Stone     Review of Systems (Janette Ranson CMA; 07/12/2017 12:16 PM) General Not Present- Appetite Loss, Chills, Fatigue, Fever, Night Sweats, Weight Gain and Weight Loss. Skin Not Present- Change in Wart/Mole, Dryness, Hives, Jaundice, New Lesions, Non-Healing Wounds, Rash and Ulcer. HEENT Not Present- Earache, Hearing Loss, Hoarseness, Nose Bleed, Oral Ulcers, Ringing in the Ears, Seasonal Allergies, Sinus Pain, Sore Throat, Visual Disturbances, Wears glasses/contact lenses and Yellow Eyes. Respiratory Not Present- Bloody sputum, Chronic Cough, Difficulty Breathing, Snoring and Wheezing. Breast Not Present- Breast Mass, Breast Pain, Nipple Discharge and Skin Changes. Cardiovascular Not  Present- Chest Pain, Difficulty Breathing Lying Down, Leg Cramps, Palpitations, Rapid Heart Rate, Shortness of Breath and Swelling of Extremities. Gastrointestinal Not Present- Abdominal Pain, Bloating, Bloody Stool, Change in Bowel Habits, Chronic diarrhea, Constipation, Difficulty Swallowing, Excessive gas, Gets full quickly at meals, Hemorrhoids, Indigestion, Nausea, Rectal Pain and Vomiting. Female Genitourinary Not Present- Frequency, Nocturia, Painful Urination, Pelvic Pain and Urgency. Musculoskeletal Not Present- Back Pain, Joint Pain, Joint Stiffness, Muscle Pain, Muscle Weakness and Swelling of Extremities. Neurological Not Present- Decreased Memory, Fainting, Headaches, Numbness, Seizures, Tingling, Tremor, Trouble walking and Weakness. Psychiatric Not Present- Anxiety, Bipolar, Change in Sleep Pattern, Depression, Fearful and Frequent crying. Endocrine Not Present- Cold Intolerance, Excessive Hunger, Hair Changes, Heat Intolerance, Hot flashes and New Diabetes. Hematology Not Present- Easy Bruising, Excessive bleeding, Gland problems, HIV and Persistent Infections.  Vitals (Janette Ranson CMA; 07/12/2017 12:17 PM) 07/12/2017 12:17 PM Weight: 145 lb Height: 64in Body Surface Area: 1.71 m Body Mass Index: 24.89 kg/m  Pulse: 72 (Regular)  BP: 120/80 (Sitting, Left Arm, Standard)      Assessment & Plan (Elray Dains C. Drina Jobst MD; 07/12/2017 12:59 PM)  CHRONIC CHOLECYSTITIS WITH CALCULUS (K80.10) Impression: Rather classic story for biliary colic with some persistent postprandial bloating suspicious for chronic cholecystitis. Obvious gallstones.  I think she would benefit from cholecystectomy. She is interested in proceeding. We will work to coordinate convenient time.  Current Plans You are being scheduled for surgery- Our schedulers will call you.  You should hear from our office's scheduling department within 5 working days about the location, date, and time of surgery.  We try to make accommodations for patient's preferences in scheduling surgery, but sometimes the OR schedule or the surgeon's schedule prevents us from making those accommodations.  If you have not heard from our office ([phone]) in 5 working days, call the office and ask for your surgeon's nurse.  If you have other questions about your diagnosis, plan, or surgery, call the office and ask for your surgeon's nurse.  Written instructions provided Pt Education - Pamphlet Given - Laparoscopic Gallbladder Surgery: discussed with patient and provided information. ` ` `  The anatomy & physiology of hepatobiliary & pancreatic function was discussed. The pathophysiology of gallbladder dysfunction was discussed. Natural history risks   without surgery was discussed. I feel the risks of no intervention will lead to serious problems that outweigh the operative risks; therefore, I recommended cholecystectomy to remove the pathology. I explained laparoscopic techniques with possible need for an open approach. Probable cholangiogram to evaluate the bilary tract was explained as well.  Risks such as bleeding, infection, abscess, leak, injury to other organs, need for further treatment, heart attack, death, and other risks were discussed. I noted a good likelihood this will help address the problem. Possibility that this will not correct all abdominal symptoms was explained. Goals of post-operative recovery were discussed as well. We will work to minimize complications. An educational handout further explaining the pathology and treatment options was given as well. Questions were answered. The patient expresses understanding & wishes to proceed with surgery.  Pt Education - [facility] Laparosopic Post Op HCI (Deanna Wiater) Pt Education - [facility] Good Bowel Health (Jamell Laymon)  Fariha Goto C. Romond Pipkins, M.D., F.A.C.S. Gastrointestinal and Minimally Invasive Surgery [facility] [address], [address] [place], [place] [address] [phone] Main / Paging   

## 2017-07-12 NOTE — H&P (Signed)
Margaret Powell 07/12/2017 12:15 PM Location: [facility] Patient #: [id number] DOB: [birth year] Married / Language: [place] / Race: White Female  History of Present Illness Margaret Powell(Jeyson Deshotel C. Malya Cirillo MD; 07/12/2017 1:01 PM) The patient is a 31 year old female who presents for evaluation of gall stones. Note for "Gall stones": ` ` ` Patient sent for surgical consultation at the request of Reinaldo RaddleBhupinder Multani, NP  Chief Complaint: Abdominal pain and gallstones. Probable need for cholecystectomy  The patient is a pleasant, young female. [place] here in February 2018. She had episode of abdominal pain radiating to her back and shoulder after eating a [facility] [place]. It woke her up at night. Felt very nauseated. Vomited.Marland Kitchen. Her husband had the same meal without any problems. No one else sick in the family. She notes most of her family has had gallbladder problems needing surgery in the past. She was concerned about the attack. Has had persistent bloating and discomfort when eating. This does not seem like heartburn or reflux. Persisting and concerning. Went to urgent care center. Suspicion for gallbladder etiology. Ultrasound confirmed gallstones. Small right renal calculus but nonobstructing. Surgical consultation requested.  No personal nor family history of GI/colon cancer, inflammatory bowel disease, irritable bowel syndrome, allergy such as Celiac Sprue, dietary/dairy problems, colitis, ulcers nor gastritis. No recent sick contacts/gastroenteritis. No travel outside the country. No changes in diet. No dysphagia to solids or liquids. No significant heartburn or reflux. No hematochezia, hematemesis, coffee ground emesis. No evidence of prior gastric/peptic ulceration. No history of hematuria or pyuria. No history of urinary tract infections or pyelonephritis. No gestational diabetes and other issues. She does not smoke. She is not  diabetic. She can walk several miles without difficulty. Occasional constipation but usually moves her bowels every day or so.  (Review of systems as stated in this history (HPI) or in the review of systems. Otherwise all other 12 point ROS are negative) ` ` ` Result Impression IMPRESSION: 1. Cholelithiasis. 2. Small nonobstructing right renal calculi Electronically Signed by: Evelina Bucyennis Clemens Result Narrative TECHNIQUE: Realtime multiplanar grayscale and limited color Doppler ultrasound of the right upper quadrant was performed. COMPARISON: None. INDICATION: epigastric, + murphy's sign FINDINGS: PANCREAS: No focal abnormalities are identified. Visualization is limited. VASCULATURE: No abdominal aortic aneurysm. Visualized IVC is patent. Portal vein is patent with normal flow direction. HEPATOBILIARY: Liver: Normal. Gallbladder: Cholelithiasis. No gallbladder wall thickening or sonographic Murphy's sign. CBD: Normal. No intrahepatic biliary ductal dilatation. RIGHT KIDNEY: Right kidney is of normal size. No hydronephrosis. No suspicious masses. Normal renal echotexture. Small nonobstructing right renal calculi MISC: N./A. Other Result Information Acute Interface, Incoming Rad Results - 06/29/2017 4:29 PM EST TECHNIQUE: Realtime multiplanar grayscale and limited color Doppler ultrasound of the right upper quadrant was performed. COMPARISON: None. INDICATION: epigastric, + murphy's sign FINDINGS: PANCREAS: No focal abnormalities are identified. Visualization is limited. VASCULATURE: No abdominal aortic aneurysm. Visualized IVC is patent. Portal vein is patent with normal flow direction. HEPATOBILIARY: Liver: Normal. Gallbladder: Cholelithiasis. No gallbladder wall thickening or sonographic Murphy's sign. CBD: Normal. No intrahepatic biliary ductal dilatation. RIGHT KIDNEY: Right kidney is of normal size. No hydronephrosis. No suspicious masses. Normal renal echotexture. Small  nonobstructing right renal calculi MISC: N./A. IMPRESSION: 1. Cholelithiasis. 2. Small nonobstructing right renal calculi   Past Surgical History (Janette Ranson, CMA; 07/12/2017 12:16 PM) No pertinent past surgical history  Diagnostic Studies History (Janette Ranson, CMA; 07/12/2017 12:16 PM) Colonoscopy never Mammogram never Pap Smear 1-5 years ago  Allergies (Janette Ranson, CMA; 07/12/2017 12:17 PM) NSAIDs  Medication History (Janette Ranson, CMA; 07/12/2017 12:17 PM) No Current Medications Medications Reconciled  Social History (Janette Ranson, CMA; 07/12/2017 12:16 PM) Alcohol use Occasional alcohol use. Caffeine use Coffee. No drug use Tobacco use Never smoker.  Family History (Janette Ranson, CMA; 07/12/2017 12:16 PM) Diabetes Mellitus Family Members In General. Hypertension Family Members In General. Migraine Headache Mother.  Pregnancy / Birth History (Janette Ranson, CMA; 07/12/2017 12:16 PM) Age at menarche 9 years. Contraceptive History Intrauterine device. Gravida 4 Irregular periods Length (months) of breastfeeding 3-6 Maternal age 31-25 Para 2  Other Problems (Janette Ranson, CMA; 07/12/2017 12:16 PM) Cholelithiasis Hemorrhoids Kidney Stone     Review of Systems (Janette Ranson CMA; 07/12/2017 12:16 PM) General Not Present- Appetite Loss, Chills, Fatigue, Fever, Night Sweats, Weight Gain and Weight Loss. Skin Not Present- Change in Wart/Mole, Dryness, Hives, Jaundice, New Lesions, Non-Healing Wounds, Rash and Ulcer. HEENT Not Present- Earache, Hearing Loss, Hoarseness, Nose Bleed, Oral Ulcers, Ringing in the Ears, Seasonal Allergies, Sinus Pain, Sore Throat, Visual Disturbances, Wears glasses/contact lenses and Yellow Eyes. Respiratory Not Present- Bloody sputum, Chronic Cough, Difficulty Breathing, Snoring and Wheezing. Breast Not Present- Breast Mass, Breast Pain, Nipple Discharge and Skin Changes. Cardiovascular Not  Present- Chest Pain, Difficulty Breathing Lying Down, Leg Cramps, Palpitations, Rapid Heart Rate, Shortness of Breath and Swelling of Extremities. Gastrointestinal Not Present- Abdominal Pain, Bloating, Bloody Stool, Change in Bowel Habits, Chronic diarrhea, Constipation, Difficulty Swallowing, Excessive gas, Gets full quickly at meals, Hemorrhoids, Indigestion, Nausea, Rectal Pain and Vomiting. Female Genitourinary Not Present- Frequency, Nocturia, Painful Urination, Pelvic Pain and Urgency. Musculoskeletal Not Present- Back Pain, Joint Pain, Joint Stiffness, Muscle Pain, Muscle Weakness and Swelling of Extremities. Neurological Not Present- Decreased Memory, Fainting, Headaches, Numbness, Seizures, Tingling, Tremor, Trouble walking and Weakness. Psychiatric Not Present- Anxiety, Bipolar, Change in Sleep Pattern, Depression, Fearful and Frequent crying. Endocrine Not Present- Cold Intolerance, Excessive Hunger, Hair Changes, Heat Intolerance, Hot flashes and New Diabetes. Hematology Not Present- Easy Bruising, Excessive bleeding, Gland problems, HIV and Persistent Infections.  Vitals (Janette Ranson CMA; 07/12/2017 12:17 PM) 07/12/2017 12:17 PM Weight: 145 lb Height: 64in Body Surface Area: 1.71 m Body Mass Index: 24.89 kg/m  Pulse: 72 (Regular)  BP: 120/80 (Sitting, Left Arm, Standard)      Assessment & Plan Margaret Powell(Gabriellia Rempel C. Amire Leazer MD; 07/12/2017 12:59 PM)  CHRONIC CHOLECYSTITIS WITH CALCULUS (K80.10) Impression: Rather classic story for biliary colic with some persistent postprandial bloating suspicious for chronic cholecystitis. Obvious gallstones.  I think she would benefit from cholecystectomy. She is interested in proceeding. We will work to coordinate convenient time.  Current Plans You are being scheduled for surgery- Our schedulers will call you.  You should hear from our office's scheduling department within 5 working days about the location, date, and time of surgery.  We try to make accommodations for patient's preferences in scheduling surgery, but sometimes the OR schedule or the surgeon's schedule prevents us from making those accommodations.  If you have not heard from our office [phone]) in 5 working days, call the office and ask for your surgeon's nurse.  If you have other questions about your diagnosis, plan, or surgery, call the office and ask for your surgeon's nurse.  Written instructions provided Pt Education - Pamphlet Given - Laparoscopic Gallbladder Surgery: discussed with patient and provided information. ` ` `  The anatomy & physiology of hepatobiliary & pancreatic function was discussed. The pathophysiology of gallbladder dysfunction was discussed. Natural history risks  without surgery was discussed. I feel the risks of no intervention will lead to serious problems that outweigh the operative risks; therefore, I recommended cholecystectomy to remove the pathology. I explained laparoscopic techniques with possible need for an open approach. Probable cholangiogram to evaluate the bilary tract was explained as well.  Risks such as bleeding, infection, abscess, leak, injury to other organs, need for further treatment, heart attack, death, and other risks were discussed. I noted a good likelihood this will help address the problem. Possibility that this will not correct all abdominal symptoms was explained. Goals of post-operative recovery were discussed as well. We will work to minimize complications. An educational handout further explaining the pathology and treatment options was given as well. Questions were answered. The patient expresses understanding & wishes to proceed with surgery.  Pt Education - [facility] Laparosopic Post Op HCI (Keiandre Cygan) Pt Education - [facility] Good Bowel Health (Onelia Cadmus)  Margaret Powell, M.D., F.A.C.S. Gastrointestinal and Minimally Invasive Surgery [facility] [address], [address] [place], [place] [address] [phone] Main / Paging

## 2017-07-19 ENCOUNTER — Encounter (HOSPITAL_COMMUNITY): Payer: Self-pay | Admitting: *Deleted

## 2017-07-19 ENCOUNTER — Other Ambulatory Visit: Payer: Self-pay

## 2017-07-20 ENCOUNTER — Other Ambulatory Visit: Payer: Self-pay

## 2017-07-20 ENCOUNTER — Ambulatory Visit (HOSPITAL_COMMUNITY)
Admission: RE | Admit: 2017-07-20 | Discharge: 2017-07-20 | Disposition: A | Discharge status: Home / Self Care | Payer: Managed Care, Other (non HMO) | Source: Ambulatory Visit | Attending: Surgery | Admitting: Surgery

## 2017-07-20 ENCOUNTER — Ambulatory Visit (HOSPITAL_COMMUNITY): Payer: Managed Care, Other (non HMO)

## 2017-07-20 ENCOUNTER — Encounter (HOSPITAL_COMMUNITY)
Admission: RE | Disposition: A | Discharge status: Home / Self Care | Payer: Self-pay | Source: Ambulatory Visit | Attending: Surgery

## 2017-07-20 ENCOUNTER — Ambulatory Visit (HOSPITAL_COMMUNITY): Payer: Managed Care, Other (non HMO) | Admitting: Anesthesiology

## 2017-07-20 ENCOUNTER — Encounter (HOSPITAL_COMMUNITY): Payer: Self-pay | Admitting: Registered Nurse

## 2017-07-20 DIAGNOSIS — K5900 Constipation, unspecified: Secondary | ICD-10-CM | POA: Insufficient documentation

## 2017-07-20 DIAGNOSIS — Z87442 Personal history of urinary calculi: Secondary | ICD-10-CM | POA: Insufficient documentation

## 2017-07-20 DIAGNOSIS — K219 Gastro-esophageal reflux disease without esophagitis: Secondary | ICD-10-CM | POA: Diagnosis not present

## 2017-07-20 DIAGNOSIS — Z9889 Other specified postprocedural states: Secondary | ICD-10-CM | POA: Diagnosis not present

## 2017-07-20 DIAGNOSIS — Z886 Allergy status to analgesic agent status: Secondary | ICD-10-CM | POA: Diagnosis not present

## 2017-07-20 DIAGNOSIS — N200 Calculus of kidney: Secondary | ICD-10-CM | POA: Insufficient documentation

## 2017-07-20 DIAGNOSIS — K8010 Calculus of gallbladder with chronic cholecystitis without obstruction: Secondary | ICD-10-CM

## 2017-07-20 DIAGNOSIS — K8064 Calculus of gallbladder and bile duct with chronic cholecystitis without obstruction: Secondary | ICD-10-CM | POA: Diagnosis not present

## 2017-07-20 DIAGNOSIS — Z975 Presence of (intrauterine) contraceptive device: Secondary | ICD-10-CM | POA: Insufficient documentation

## 2017-07-20 DIAGNOSIS — K829 Disease of gallbladder, unspecified: Secondary | ICD-10-CM

## 2017-07-20 HISTORY — DX: Personal history of urinary calculi: Z87.442

## 2017-07-20 HISTORY — DX: Calculus of gallbladder without cholecystitis without obstruction: K80.20

## 2017-07-20 HISTORY — DX: Family history of other specified conditions: Z84.89

## 2017-07-20 HISTORY — DX: Gastro-esophageal reflux disease without esophagitis: K21.9

## 2017-07-20 HISTORY — DX: Other specified postprocedural states: Z98.890

## 2017-07-20 HISTORY — DX: Headache, unspecified: R51.9

## 2017-07-20 HISTORY — DX: Nausea with vomiting, unspecified: R11.2

## 2017-07-20 HISTORY — DX: Chronic cholecystitis: K81.1

## 2017-07-20 HISTORY — PX: LAPAROSCOPIC CHOLECYSTECTOMY SINGLE SITE WITH INTRAOPERATIVE CHOLANGIOGRAM: SHX6538

## 2017-07-20 HISTORY — DX: Headache: R51

## 2017-07-20 LAB — CBC
HEMATOCRIT: 40.7 % (ref 36.0–46.0)
Hemoglobin: 13.8 g/dL (ref 12.0–15.0)
MCH: 31.7 pg (ref 26.0–34.0)
MCHC: 33.9 g/dL (ref 30.0–36.0)
MCV: 93.6 fL (ref 78.0–100.0)
PLATELETS: 274 10*3/uL (ref 150–400)
RBC: 4.35 MIL/uL (ref 3.87–5.11)
RDW: 12.3 % (ref 11.5–15.5)
WBC: 5.4 10*3/uL (ref 4.0–10.5)

## 2017-07-20 LAB — PREGNANCY, URINE: PREG TEST UR: NEGATIVE

## 2017-07-20 SURGERY — LAPAROSCOPIC CHOLECYSTECTOMY SINGLE SITE WITH INTRAOPERATIVE CHOLANGIOGRAM
Anesthesia: General | Site: Abdomen

## 2017-07-20 MED ORDER — FENTANYL CITRATE (PF) 250 MCG/5ML IJ SOLN
INTRAMUSCULAR | Status: DC | PRN
  Administered 2017-07-20: 100 ug via INTRAVENOUS
  Administered 2017-07-20: 50 ug via INTRAVENOUS
  Administered 2017-07-20: 100 ug via INTRAVENOUS

## 2017-07-20 MED ORDER — SUGAMMADEX SODIUM 200 MG/2ML IV SOLN
INTRAVENOUS | Status: DC | PRN
  Administered 2017-07-20: 200 mg via INTRAVENOUS

## 2017-07-20 MED ORDER — ACETAMINOPHEN 650 MG RE SUPP
650.0000 mg | RECTAL | Status: DC | PRN
  Filled 2017-07-20: qty 1

## 2017-07-20 MED ORDER — ROCURONIUM BROMIDE 10 MG/ML (PF) SYRINGE
PREFILLED_SYRINGE | INTRAVENOUS | Status: DC | PRN
  Administered 2017-07-20: 40 mg via INTRAVENOUS

## 2017-07-20 MED ORDER — LACTATED RINGERS IR SOLN
Status: DC | PRN
  Administered 2017-07-20: 1000 mL

## 2017-07-20 MED ORDER — ONDANSETRON HCL 4 MG/2ML IJ SOLN
INTRAMUSCULAR | Status: DC | PRN
  Administered 2017-07-20: 4 mg via INTRAVENOUS

## 2017-07-20 MED ORDER — ONDANSETRON HCL 4 MG/2ML IJ SOLN
INTRAMUSCULAR | Status: AC
  Filled 2017-07-20: qty 2

## 2017-07-20 MED ORDER — MIDAZOLAM HCL 2 MG/2ML IJ SOLN
INTRAMUSCULAR | Status: AC
  Filled 2017-07-20: qty 2

## 2017-07-20 MED ORDER — FENTANYL CITRATE (PF) 250 MCG/5ML IJ SOLN
INTRAMUSCULAR | Status: AC
  Filled 2017-07-20: qty 5

## 2017-07-20 MED ORDER — FENTANYL CITRATE (PF) 100 MCG/2ML IJ SOLN: 25.0000 ug | INTRAMUSCULAR | Status: DC | PRN

## 2017-07-20 MED ORDER — ROCURONIUM BROMIDE 50 MG/5ML IV SOSY
PREFILLED_SYRINGE | INTRAVENOUS | Status: AC
  Filled 2017-07-20: qty 10

## 2017-07-20 MED ORDER — KETOROLAC TROMETHAMINE 30 MG/ML IJ SOLN
INTRAMUSCULAR | Status: DC
Start: 2017-07-20 — End: 2017-07-20
  Filled 2017-07-20: qty 1

## 2017-07-20 MED ORDER — DEXAMETHASONE SODIUM PHOSPHATE 10 MG/ML IJ SOLN
INTRAMUSCULAR | Status: AC
  Filled 2017-07-20: qty 1

## 2017-07-20 MED ORDER — ONDANSETRON HCL 4 MG/2ML IJ SOLN
4.0000 mg | Freq: Once | INTRAMUSCULAR | Status: AC | PRN
  Administered 2017-07-20: 4 mg via INTRAVENOUS
  Filled 2017-07-20: qty 2

## 2017-07-20 MED ORDER — BUPIVACAINE-EPINEPHRINE 0.25% -1:200000 IJ SOLN
INTRAMUSCULAR | Status: AC
  Filled 2017-07-20: qty 1

## 2017-07-20 MED ORDER — BUPIVACAINE-EPINEPHRINE 0.25% -1:200000 IJ SOLN
INTRAMUSCULAR | Status: DC | PRN
  Administered 2017-07-20: 30 mL

## 2017-07-20 MED ORDER — 0.9 % SODIUM CHLORIDE (POUR BTL) OPTIME
TOPICAL | Status: DC | PRN
  Administered 2017-07-20: 1000 mL

## 2017-07-20 MED ORDER — OXYCODONE HCL 5 MG PO TABS: 5.0000 mg | ORAL_TABLET | Freq: Once | ORAL | Status: DC | PRN

## 2017-07-20 MED ORDER — LIDOCAINE 2% (20 MG/ML) 5 ML SYRINGE
INTRAMUSCULAR | Status: DC | PRN
  Administered 2017-07-20: 80 mg via INTRAVENOUS

## 2017-07-20 MED ORDER — SCOPOLAMINE 1 MG/3DAYS TD PT72
MEDICATED_PATCH | TRANSDERMAL | Status: AC
  Filled 2017-07-20: qty 1

## 2017-07-20 MED ORDER — PROPOFOL 10 MG/ML IV BOLUS
INTRAVENOUS | Status: DC | PRN
  Administered 2017-07-20: 160 mg via INTRAVENOUS

## 2017-07-20 MED ORDER — KETOROLAC TROMETHAMINE 30 MG/ML IJ SOLN: 30.0000 mg | Freq: Once | INTRAMUSCULAR | Status: DC | PRN

## 2017-07-20 MED ORDER — SUGAMMADEX SODIUM 200 MG/2ML IV SOLN
INTRAVENOUS | Status: AC
  Filled 2017-07-20: qty 2

## 2017-07-20 MED ORDER — SODIUM CHLORIDE 0.9% FLUSH: 3.0000 mL | Freq: Two times a day (BID) | INTRAVENOUS | Status: DC

## 2017-07-20 MED ORDER — ACETAMINOPHEN 160 MG/5ML PO SOLN: 325.0000 mg | ORAL | Status: DC | PRN

## 2017-07-20 MED ORDER — SCOPOLAMINE 1 MG/3DAYS TD PT72
MEDICATED_PATCH | TRANSDERMAL | Status: DC | PRN
  Administered 2017-07-20: 1 via TRANSDERMAL

## 2017-07-20 MED ORDER — OXYCODONE HCL 5 MG PO TABS
5.0000 mg | ORAL_TABLET | ORAL | Status: DC | PRN
  Administered 2017-07-20: 5 mg via ORAL
  Filled 2017-07-20: qty 1

## 2017-07-20 MED ORDER — SODIUM CHLORIDE 0.9 % IV SOLN
INTRAVENOUS | Status: DC | PRN
  Administered 2017-07-20: 10 mL

## 2017-07-20 MED ORDER — SODIUM CHLORIDE 0.9% FLUSH: 3.0000 mL | INTRAVENOUS | Status: DC | PRN

## 2017-07-20 MED ORDER — TRAMADOL HCL 50 MG PO TABS: 50.0000 mg | ORAL_TABLET | Freq: Four times a day (QID) | ORAL | 0 refills | Status: DC | PRN

## 2017-07-20 MED ORDER — MEPERIDINE HCL 50 MG/ML IJ SOLN: 6.2500 mg | INTRAMUSCULAR | Status: DC | PRN

## 2017-07-20 MED ORDER — LIDOCAINE 2% (20 MG/ML) 5 ML SYRINGE
INTRAMUSCULAR | Status: AC
  Filled 2017-07-20: qty 20

## 2017-07-20 MED ORDER — ACETAMINOPHEN 500 MG PO TABS
1000.0000 mg | ORAL_TABLET | ORAL | Status: AC
  Administered 2017-07-20: 1000 mg via ORAL
  Filled 2017-07-20: qty 2

## 2017-07-20 MED ORDER — DEXAMETHASONE SODIUM PHOSPHATE 10 MG/ML IJ SOLN
INTRAMUSCULAR | Status: DC | PRN
  Administered 2017-07-20: 10 mg via INTRAVENOUS

## 2017-07-20 MED ORDER — ACETAMINOPHEN 325 MG PO TABS: 650.0000 mg | ORAL_TABLET | ORAL | Status: DC | PRN

## 2017-07-20 MED ORDER — CHLORHEXIDINE GLUCONATE CLOTH 2 % EX PADS
6.0000 | MEDICATED_PAD | Freq: Once | CUTANEOUS | Status: AC
  Administered 2017-07-20: 6 via TOPICAL

## 2017-07-20 MED ORDER — LACTATED RINGERS IV SOLN
INTRAVENOUS | Status: DC
  Administered 2017-07-20 (×2): via INTRAVENOUS

## 2017-07-20 MED ORDER — PROPOFOL 10 MG/ML IV BOLUS
INTRAVENOUS | Status: AC
  Filled 2017-07-20: qty 20

## 2017-07-20 MED ORDER — IOPAMIDOL (ISOVUE-300) INJECTION 61%
INTRAVENOUS | Status: AC
  Filled 2017-07-20: qty 50

## 2017-07-20 MED ORDER — ACETAMINOPHEN 325 MG PO TABS: 325.0000 mg | ORAL_TABLET | ORAL | Status: DC | PRN

## 2017-07-20 MED ORDER — GABAPENTIN 300 MG PO CAPS
300.0000 mg | ORAL_CAPSULE | ORAL | Status: AC
  Administered 2017-07-20: 300 mg via ORAL
  Filled 2017-07-20: qty 1

## 2017-07-20 MED ORDER — MIDAZOLAM HCL 5 MG/5ML IJ SOLN
INTRAMUSCULAR | Status: DC | PRN
  Administered 2017-07-20: 2 mg via INTRAVENOUS

## 2017-07-20 MED ORDER — OXYCODONE HCL 5 MG/5ML PO SOLN
5.0000 mg | Freq: Once | ORAL | Status: DC | PRN
  Filled 2017-07-20: qty 5

## 2017-07-20 MED ORDER — SODIUM CHLORIDE 0.9 % IV SOLN: 250.0000 mL | INTRAVENOUS | Status: DC | PRN

## 2017-07-20 SURGICAL SUPPLY — 38 items
APPLIER CLIP 5 13 M/L LIGAMAX5 (MISCELLANEOUS) ×3
CABLE HIGH FREQUENCY MONO STRZ (ELECTRODE) ×3 IMPLANT
CHLORAPREP W/TINT 26ML (MISCELLANEOUS) ×3 IMPLANT
CLIP APPLIE 5 13 M/L LIGAMAX5 (MISCELLANEOUS) ×1 IMPLANT
COVER MAYO STAND STRL (DRAPES) ×3 IMPLANT
COVER SURGICAL LIGHT HANDLE (MISCELLANEOUS) ×3 IMPLANT
DECANTER SPIKE VIAL GLASS SM (MISCELLANEOUS) ×3 IMPLANT
DRAIN CHANNEL 19F RND (DRAIN) IMPLANT
DRAPE C-ARM 42X120 X-RAY (DRAPES) ×3 IMPLANT
DRAPE WARM FLUID 44X44 (DRAPE) ×3 IMPLANT
DRSG TEGADERM 4X4.75 (GAUZE/BANDAGES/DRESSINGS) ×3 IMPLANT
ELECT REM PT RETURN 15FT ADLT (MISCELLANEOUS) ×3 IMPLANT
ENDOLOOP SUT PDS II  0 18 (SUTURE)
ENDOLOOP SUT PDS II 0 18 (SUTURE) IMPLANT
EVACUATOR SILICONE 100CC (DRAIN) IMPLANT
GAUZE SPONGE 2X2 8PLY STRL LF (GAUZE/BANDAGES/DRESSINGS) ×1 IMPLANT
GLOVE ECLIPSE 8.0 STRL XLNG CF (GLOVE) ×3 IMPLANT
GLOVE INDICATOR 8.0 STRL GRN (GLOVE) ×3 IMPLANT
GOWN STRL REUS W/TWL XL LVL3 (GOWN DISPOSABLE) ×6 IMPLANT
IRRIG SUCT STRYKERFLOW 2 WTIP (MISCELLANEOUS) ×3
IRRIGATION SUCT STRKRFLW 2 WTP (MISCELLANEOUS) ×1 IMPLANT
KIT BASIN OR (CUSTOM PROCEDURE TRAY) ×3 IMPLANT
PAD POSITIONING PINK XL (MISCELLANEOUS) ×3 IMPLANT
POSITIONER SURGICAL ARM (MISCELLANEOUS) IMPLANT
POUCH SPECIMEN RETRIEVAL 10MM (ENDOMECHANICALS) IMPLANT
SCISSORS LAP 5X35 DISP (ENDOMECHANICALS) ×3 IMPLANT
SET CHOLANGIOGRAPH MIX (MISCELLANEOUS) ×3 IMPLANT
SHEARS HARMONIC ACE PLUS 36CM (ENDOMECHANICALS) ×3 IMPLANT
SPONGE GAUZE 2X2 STER 10/PKG (GAUZE/BANDAGES/DRESSINGS) ×2
SUT MNCRL AB 4-0 PS2 18 (SUTURE) ×3 IMPLANT
SUT PDS AB 1 CT1 27 (SUTURE) ×6 IMPLANT
SYR 20CC LL (SYRINGE) ×3 IMPLANT
TOWEL OR 17X26 10 PK STRL BLUE (TOWEL DISPOSABLE) ×3 IMPLANT
TOWEL OR NON WOVEN STRL DISP B (DISPOSABLE) ×3 IMPLANT
TRAY LAPAROSCOPIC (CUSTOM PROCEDURE TRAY) ×3 IMPLANT
TROCAR BLADELESS OPT 5 100 (ENDOMECHANICALS) ×3 IMPLANT
TROCAR BLADELESS OPT 5 150 (ENDOMECHANICALS) ×3 IMPLANT
TUBING INSUF HEATED (TUBING) ×3 IMPLANT

## 2017-07-20 NOTE — Anesthesia Preprocedure Evaluation (Addendum)
Anesthesia Evaluation  Patient identified by MRN, date of birth, ID band Patient awake    Reviewed: Allergy & Precautions, NPO status , Patient's Chart, lab work & pertinent test results  History of Anesthesia Complications (+) PONV, Family history of anesthesia reaction and history of anesthetic complications  Airway Mallampati: II  TM Distance: >3 FB Neck ROM: Full    Dental  (+) Teeth Intact, Dental Advisory Given   Pulmonary neg pulmonary ROS,    Pulmonary exam normal breath sounds clear to auscultation       Cardiovascular negative cardio ROS Normal cardiovascular exam Rhythm:Regular Rate:Normal     Neuro/Psych  Headaches, negative neurological ROS     GI/Hepatic negative GI ROS, Neg liver ROS, GERD  ,  Endo/Other  Obesity   Renal/GU negative Renal ROS     Musculoskeletal negative musculoskeletal ROS (+)   Abdominal   Peds  Hematology negative hematology ROS (+) Plt 230k   Anesthesia Other Findings Day of surgery medications reviewed with the patient.  Reproductive/Obstetrics                           Anesthesia Physical  Anesthesia Plan  ASA: II  Anesthesia Plan: General   Post-op Pain Management:    Induction: Intravenous  PONV Risk Score and Plan: 3 and Ondansetron, Dexamethasone, Treatment may vary due to age or medical condition and Scopolamine patch - Pre-op  Airway Management Planned: Oral ETT  Additional Equipment:   Intra-op Plan:   Post-operative Plan:   Informed Consent: I have reviewed the patients History and Physical, chart, labs and discussed the procedure including the risks, benefits and alternatives for the proposed anesthesia with the patient or authorized representative who has indicated his/her understanding and acceptance.   Dental advisory given  Plan Discussed with: CRNA and Surgeon  Anesthesia Plan Comments: (Patient identified.  Risks/Benefits/Options discussed with patient including but not limited to bleeding, infection, nerve damage, paralysis, failed block, incomplete pain control, headache, blood pressure changes, nausea, vomiting, reactions to medication both or allergic, itching and postpartum back pain. Confirmed with bedside nurse the patient's most recent platelet count. Confirmed with patient that they are not currently taking any anticoagulation, have any bleeding history or any family history of bleeding disorders. Patient expressed understanding and wished to proceed. All questions were answered. )      Anesthesia Quick Evaluation

## 2017-07-20 NOTE — Transfer of Care (Signed)
Immediate Anesthesia Transfer of Care Note  Patient: Margaret Powell  Procedure(s) Performed: LAPAROSCOPIC CHOLECYSTECTOMY SINGLE SITE WITH INTRAOPERATIVE CHOLANGIOGRAM ERAS PATHWAY (N/A Abdomen)  Patient Location: PACU  Anesthesia Type:General   Level of Consciousness: sedated  Airway & Oxygen Therapy: Patient Spontanous Breathing and Patient connected to face mask oxygen  Post-op Assessment: Report given to RN and Post -op Vital signs reviewed and stable  Post vital signs: Reviewed and stable  Last Vitals:  Vitals:   07/20/17 1334  BP: 121/75  Pulse: 78  Resp: 16  Temp: 36.7 C  SpO2: 100%    Last Pain:  Vitals:   07/20/17 1334  TempSrc: Oral      Patients Stated Pain Goal: 5 (07/20/17 1404)  Complications: No apparent anesthesia complications

## 2017-07-20 NOTE — Anesthesia Postprocedure Evaluation (Signed)
Anesthesia Post Note  Patient: Margaret Powell  Procedure(s) Performed: LAPAROSCOPIC CHOLECYSTECTOMY SINGLE SITE WITH INTRAOPERATIVE CHOLANGIOGRAM ERAS PATHWAY (N/A Abdomen)     Patient location during evaluation: PACU Anesthesia Type: General Level of consciousness: awake and alert Pain management: pain level controlled Vital Signs Assessment: post-procedure vital signs reviewed and stable Respiratory status: spontaneous breathing, nonlabored ventilation, respiratory function stable and patient connected to nasal cannula oxygen Cardiovascular status: blood pressure returned to baseline and stable Postop Assessment: no apparent nausea or vomiting Anesthetic complications: no    Last Vitals:  Vitals:   07/20/17 1613 07/20/17 1615  BP: 136/88 137/89  Pulse: (!) 109 (!) 108  Resp: (!) 22 (!) 23  Temp: 36.6 C   SpO2: 100% 100%    Last Pain:  Vitals:   07/20/17 1334  TempSrc: Oral                 Atha Muradyan

## 2017-07-20 NOTE — Op Note (Signed)
07/20/2017  PATIENT:  Margaret Powell  31 y.o. female  Patient Care Team: Patient, No Pcp Per as PCP - General (General Practice) Karie SodaGross, Guillermo Nehring, MD as Consulting Physician (General Surgery) Sherian ReinBovard-Stuckert, Jody, MD as Consulting Physician (Obstetrics and Gynecology) Reinaldo RaddleMultani, Bhupinder, NP as Nurse Practitioner (Family Medicine)  PRE-OPERATIVE DIAGNOSIS:    Chronic Calculus cholecystitis  POST-OPERATIVE DIAGNOSIS:   Chronic Calculus cholecystitis  Liver: normal  PROCEDURE:  SINGLE SITE Laparoscopic cholecystectomy with intraoperative cholangiogram  SURGEON:  Ardeth SportsmanSteven C. Jenetta Wease, MD, FACS.  ASSISTANT: OR Staff   ANESTHESIA:    General with endotracheal intubation Local anesthetic as a field block  EBL:  (See Anesthesia Intraoperative Record) Total I/O In: 1000 [I.V.:1000] Out: 10 [Blood:10]  Delay start of Pharmacological VTE agent (>24hrs) due to surgical blood loss or risk of bleeding:  no  DRAINS: None   SPECIMEN: Gallbladder    DISPOSITION OF SPECIMEN:  PATHOLOGY  COUNTS:  YES  PLAN OF CARE: Discharge to home after PACU  PATIENT DISPOSITION:  PACU - hemodynamically stable.  INDICATION: Patient with biliary colic and gallstones.  The rest of the differential diagnoses otherwise seems unlikely.  I felt she would benefit from cholecystectomy.  The anatomy & physiology of hepatobiliary & pancreatic function was discussed.  The pathophysiology of gallbladder dysfunction was discussed.  Natural history risks without surgery was discussed.   I feel the risks of no intervention will lead to serious problems that outweigh the operative risks; therefore, I recommended cholecystectomy to remove the pathology.  I explained laparoscopic techniques with possible need for an open approach.  Probable cholangiogram to evaluate the bilary tract was explained as well.    Risks such as bleeding, infection, abscess, leak, injury to other organs, need for further treatment, heart  attack, death, and other risks were discussed.  I noted a good likelihood this will help address the problem.  Possibility that this will not correct all abdominal symptoms was explained.  Goals of post-operative recovery were discussed as well.  We will work to minimize complications.  An educational handout further explaining the pathology and treatment options was given as well.  Questions were answered.  The patient expresses understanding & wishes to proceed with surgery.  OR FINDINGS: Boggy gallbladder with gallbladder wall thickening.  Narrowed cystic duct.  Cholangiogram with classic biliary anatomy and no obstruction.  Liver: normal  DESCRIPTION:   The patient was identified & brought in the operating room. The patient was positioned supine with arms tucked. SCDs were active during the entire case. The patient underwent general anesthesia without any difficulty.  The abdomen was prepped and draped in a sterile fashion. A Surgical Timeout confirmed our plan.  I made a transverse curvilinear incision through the superior umbilical fold.  I placed a 5mm long port through the supraumbilical fascia using a modified Hassan cutdown technique with umbilical stalk fascial countertraction. I began carbon dioxide insufflation.  No change in end tidal CO2 measurement.   Camera inspection revealed no injury. There were no adhesions to the anterior abdominal wall supraumbilically.  I proceeded to continue with single site technique. I placed a #5 port in left upper aspect of the wound. I placed a 5 mm atraumatic grasper in the right inferior aspect of the wound.  I turned attention to the right upper quadrant.  Gallbladder wall somewhat thickened consistent with chronic cholecystitis.  No major inflammation.  The gallbladder fundus was elevated cephalad. I freed adhesions to the ventral surface of the gallbladder off carefully.  I freed the peritoneal coverings between the gallbladder and the liver on the  posteriolateral and anteriomedial walls. I alternated between Harmonic & blunt Maryland dissection to help get a good critical view of the cystic artery and cystic duct. I did further dissection to free 80% of the gallbladder off the liver bed to get a good critical view of the infundibulum and cystic duct. I dissected out the cystic artery; and, after getting a good 360 view, ligated the anterior & posterior branches of the cystic artery close on the infundibulum using the Harmonic ultrasonic dissection.  I skeletonized the cystic duct.  I placed a clip on the infundibulum. I did a partial cystic duct-otomy and ensured patency. I placed a 5 [place] cholangiocatheter through a puncture site at the right subcostal ridge of the abdominal wall and directed it into the cystic duct.  Rather fibrotic and thickened at the infundibular/cystic duct region.  Fluoroscopy was delayed, so I completed gallbladder dissection off the liver hemostasis on the liver.  We ran a cholangiogram with dilute radio-opaque contrast and continuous fluoroscopy. Contrast flowed from a side branch consistent with cystic duct cannulization. Contrast flowed up the common hepatic duct into the right and left intrahepatic chains out to secondary radicals. Contrast flowed down the common bile duct easily across the normal ampulla into the duodenum.  This was consistent with a normal cholangiogram.  I removed the cholangiocatheter. I placed clips on the cystic duct x4.  I ensured hemostasis on the gallbladder fossa of the liver and elsewhere. I inspected the rest of the abdomen & detected no injury nor bleeding elsewhere.  I removed the gallbladder out the supraumbilical fascia. I closed the fascia transversely using #1 PDS interrupted stitches. I closed the skin using 4-0 monocryl stitch.  Sterile dressing was applied. The patient was extubated & arrived in the PACU in stable condition..  I had discussed postoperative care with the patient in  the holding area.  I made an attempt to locate family to discuss patient's status and recommendations.  No one is available at this time.  I will try again later  Ardeth SportsmanSteven C. Caden Fukushima, M.D., F.A.C.S. Gastrointestinal and Minimally Invasive Surgery [facility] [address] [place], [place] [id number] [phone] Main / Paging  07/20/2017 4:04 PM

## 2017-07-20 NOTE — Anesthesia Procedure Notes (Signed)
Procedure Name: Intubation Date/Time: 07/20/2017 2:54 PM Performed by: Jhonnie GarnerMarshall, Ameer Sanden M, CRNA Pre-anesthesia Checklist: Emergency Drugs available, Patient identified, Suction available and Patient being monitored Patient Re-evaluated:Patient Re-evaluated prior to induction Oxygen Delivery Method: Circle system utilized Preoxygenation: Pre-oxygenation with 100% oxygen Induction Type: IV induction Ventilation: Mask ventilation without difficulty Laryngoscope Size: Miller and 2 Grade View: Grade I Tube type: Oral Tube size: 7.0 mm Number of attempts: 1 Airway Equipment and Method: Stylet Placement Confirmation: ETT inserted through vocal cords under direct vision,  positive ETCO2 and breath sounds checked- equal and bilateral Secured at: 20 cm Tube secured with: Tape Dental Injury: Teeth and Oropharynx as per pre-operative assessment

## 2017-07-20 NOTE — Progress Notes (Signed)
Ambulated in hallway from 1301 to 1310 twice after surgery for laparoscopic cholecystectomy. Tolerated well.

## 2017-07-20 NOTE — Discharge Instructions (Signed)
You have a scopalamine patch behind your ear. Remove it 07/21/17 . Do not get any ointment on your fingers. Wash the ointment off your skin.     LAPAROSCOPIC SURGERY: POST OP INSTRUCTIONS  ######################################################################  EAT Gradually transition to a high fiber diet with a fiber supplement over the next few weeks after discharge.  Start with a pureed / full liquid diet (see below)  WALK Walk an hour a day.  Control your pain to do that.    CONTROL PAIN Control pain so that you can walk, sleep, tolerate sneezing/coughing, go up/down stairs.  HAVE A BOWEL MOVEMENT DAILY Keep your bowels regular to avoid problems.  OK to try a laxative to override constipation.  OK to use an antidairrheal to slow down diarrhea.  Call if not better after 2 tries  CALL IF YOU HAVE PROBLEMS/CONCERNS Call if you are still struggling despite following these instructions. Call if you have concerns not answered by these instructions  ######################################################################    1. DIET: Follow a light bland diet the first 24 hours after arrival home, such as soup, liquids, crackers, etc.  Be sure to include lots of fluids daily.  Avoid fast food or heavy meals as your are more likely to get nauseated.  Eat a low fat the next few days after surgery.   2. Take your usually prescribed home medications unless otherwise directed. 3. PAIN CONTROL: a. Pain is best controlled by a usual combination of three different methods TOGETHER: i. Ice/Heat ii. Over the counter pain medication iii. Prescription pain medication b. Most patients will experience some swelling and bruising around the incisions.  Ice packs or heating pads (30-60 minutes up to 6 times a day) will help. Use ice for the first few days to help decrease swelling and bruising, then switch to heat to help relax tight/sore spots and speed recovery.  Some people prefer to use ice alone,  heat alone, alternating between ice & heat.  Experiment to what works for you.  Swelling and bruising can take several weeks to resolve.   c. It is helpful to take an over-the-counter pain medication regularly for the first few weeks.  Choose one of the following that works best for you: i. Naproxen (Aleve, etc)  Two 220mg  tabs twice a day ii. Ibuprofen (Advil, etc) Three 200mg  tabs four times a day (every meal & bedtime) iii. Acetaminophen (Tylenol, etc) 500-650mg  four times a day (every meal & bedtime) d. A  prescription for pain medication (such as oxycodone, hydrocodone, etc) should be given to you upon discharge.  Take your pain medication as prescribed.  i. If you are having problems/concerns with the prescription medicine (does not control pain, nausea, vomiting, rash, itching, etc), please call us [phone] to see if we need to switch you to a different pain medicine that will work better for you and/or control your side effect better. ii. If you need a refill on your pain medication, please contact your pharmacy.  They will contact our office to request authorization. Prescriptions will not be filled after 5 pm or on week-ends. 4. Avoid getting constipated.  Between the surgery and the pain medications, it is common to experience some constipation.  Increasing fluid intake and taking a fiber supplement (such as Metamucil, Citrucel, FiberCon, MiraLax, etc) 1-2 times a day regularly will usually help prevent this problem from occurring.  A mild laxative (prune juice, Milk of Magnesia, MiraLax, etc) should be taken according to package directions if there are  no bowel movements after 48 hours.   5. Watch out for diarrhea.  If you have many loose bowel movements, simplify your diet to bland foods & liquids for a few days.  Stop any stool softeners and decrease your fiber supplement.  Switching to mild anti-diarrheal medications (Kayopectate, Pepto Bismol) can help.  If this worsens or does not  improve, please call us. 6. Wash / shower every day.  You may shower over the dressings as they are waterproof.  Continue to shower over incision(s) after the dressing is off. 7. Remove your waterproof bandages 5 days after surgery.  You may leave the incision open to air.  You may replace a dressing/Band-Aid to cover the incision for comfort if you wish.  8. ACTIVITIES as tolerated:   a. You may resume regular (light) daily activities beginning the next day--such as daily self-care, walking, climbing stairs--gradually increasing activities as tolerated.  If you can walk 30 minutes without difficulty, it is safe to try more intense activity such as jogging, treadmill, bicycling, low-impact aerobics, swimming, etc. b. Save the most intensive and strenuous activity for last such as sit-ups, heavy lifting, contact sports, etc  Refrain from any heavy lifting or straining until you are off narcotics for pain control.   c. DO NOT PUSH THROUGH PAIN.  Let pain be your guide: If it hurts to do something, don't do it.  Pain is your body warning you to avoid that activity for another week until the pain goes down. d. You may drive when you are no longer taking prescription pain medication, you can comfortably wear a seatbelt, and you can safely maneuver your car and apply brakes. e. Bonita QuinYou may have sexual intercourse when it is comfortable.  9. FOLLOW UP in our office a. Please call [facility] at [phone] to set up an appointment to see your surgeon in the office for a follow-up appointment approximately 2-3 weeks after your surgery. b. Make sure that you call for this appointment the day you arrive home to insure a convenient appointment time. 10. IF YOU HAVE DISABILITY OR FAMILY LEAVE FORMS, BRING THEM TO THE OFFICE FOR PROCESSING.  DO NOT GIVE THEM TO YOUR DOCTOR.   WHEN TO CALL US [phone]: 1. Poor pain control 2. Reactions / problems with new medications (rash/itching, nausea, etc)  3. Fever over 101.5  F (38.5 C) 4. Inability to urinate 5. Nausea and/or vomiting 6. Worsening swelling or bruising 7. Continued bleeding from incision. 8. Increased pain, redness, or drainage from the incision   The clinic staff is available to answer your questions during regular business hours (8:30am-5pm).  Please dont hesitate to call and ask to speak to one of our nurses for clinical concerns.   If you have a medical emergency, go to the nearest emergency room or call 911.  [facility] always [facility]   [facility] [place] [address], [address], [place], [place]  [address] ? MAIN: [phone] ? TOLL FREE: [phone] ?  FAX [phone] [email or web address]   Cholecystitis Cholecystitis is inflammation of the gallbladder. It is often called a gallbladder attack. The gallbladder is a pear-shaped organ that lies beneath the liver on the right side of the body. The gallbladder stores bile, which is a fluid that helps the body to digest fats. If bile builds up in your gallbladder, your gallbladder becomes inflamed. This condition may occur suddenly (be acute). Repeat episodes of  acute cholecystitis or prolonged episodes may lead to a long-term (chronic) condition. Cholecystitis is serious and it requires treatment. What are the causes? The most common cause of this condition is gallstones. Gallstones can block the tube (duct) that carries bile out of your gallbladder. This causes bile to build up. Other causes of this condition include:  Damage to the gallbladder due to a decrease in blood flow.  Infections in the bile ducts.  Scars or kinks in the bile ducts.  Tumors in the liver, pancreas, or gallbladder.  What increases the risk? This condition is more likely to develop in:  People who have sickle cell disease.  People who take birth control pills or use estrogen.  People who have alcoholic liver  disease.  People who have liver cirrhosis.  People who have their nutrition delivered through a vein (parenteral nutrition).  People who do not eat or drink (do fasting) for a long period of time.  People who are obese.  People who have rapid weight loss.  People who are pregnant.  People who have increased triglyceride levels.  People who have pancreatitis.  What are the signs or symptoms? Symptoms of this condition include:  Abdominal pain, especially in the upper right area of the abdomen.  Abdominal tenderness or bloating.  Nausea.  Vomiting.  Fever.  Chills.  Yellowing of the skin and the whites of the eyes (jaundice).  How is this diagnosed? This condition is diagnosed with a medical history and physical exam. You may also have other tests, including:  Imaging tests, such as: ? An ultrasound of the gallbladder. ? A CT scan of the abdomen. ? A gallbladder nuclear scan (HIDA scan). This scan allows your health care provider to see the bile moving from your liver to your gallbladder and to your small intestine. ? MRI.  Blood tests, such as: ? A complete blood count, because the white blood cell count may be higher than normal. ? Liver function tests, because some levels may be higher than normal with certain types of gallstones.  How is this treated? Treatment may include:  Fasting for a certain amount of time.  IV fluids.  Medicine to treat pain or vomiting.  Antibiotic medicine.  Surgery to remove your gallbladder (cholecystectomy). This may happen immediately or at a later time.  Follow these instructions at home: Home care will depend on your treatment. In general:  Take over-the-counter and prescription medicines only as told by your health care provider.  If you were prescribed an antibiotic medicine, take it as told by your health care provider. Do not stop taking the antibiotic even if you start to feel better.  Follow instructions from  your health care provider about what to eat or drink. When you are allowed to eat, avoid eating or drinking anything that triggers your symptoms.  Keep all follow-up visits as told by your health care provider. This is important.  Contact a health care provider if:  Your pain is not controlled with medicine.  You have a fever. Get help right away if:  Your pain moves to another part of your abdomen or to your back.  You continue to have symptoms or you develop new symptoms even with treatment. This information is not intended to replace advice given to you by your health care provider. Make sure you discuss any questions you have with your health care provider. Document Released: 07/19/2005 Document Revised: 11/27/2015 Document Reviewed: 10/30/2014 Elsevier Interactive Patient Education  2018 [employer].  General Anesthesia, Adult, Care After These instructions provide you with information about caring for yourself after your procedure. Your health care provider may also give you more specific instructions. Your treatment has been planned according to current medical practices, but problems sometimes occur. Call your health care provider if you have any problems or questions after your procedure. What can I expect after the procedure? After the procedure, it is common to have:  Vomiting.  A sore throat.  Mental slowness.  It is common to feel:  Nauseous.  Cold or shivery.  Sleepy.  Tired.  Sore or achy, even in parts of your body where you did not have surgery.  Follow these instructions at home: For at least 24 hours after the procedure:  Do not: ? Participate in activities where you could fall or become injured. ? Drive. ? Use heavy machinery. ? Drink alcohol. ? Take sleeping pills or medicines that cause drowsiness. ? Make important decisions or sign legal documents. ? Take care of children on your own.  Rest. Eating and drinking  If you vomit, drink  water, juice, or soup when you can drink without vomiting.  Drink enough fluid to keep your urine clear or pale yellow.  Make sure you have little or no nausea before eating solid foods.  Follow the diet recommended by your health care provider. General instructions  Have a responsible adult stay with you until you are awake and alert.  Return to your normal activities as told by your health care provider. Ask your health care provider what activities are safe for you.  Take over-the-counter and prescription medicines only as told by your health care provider.  If you smoke, do not smoke without supervision.  Keep all follow-up visits as told by your health care provider. This is important. Contact a health care provider if:  You continue to have nausea or vomiting at home, and medicines are not helpful.  You cannot drink fluids or start eating again.  You cannot urinate after 8-12 hours.  You develop a skin rash.  You have fever.  You have increasing redness at the site of your procedure. Get help right away if:  You have difficulty breathing.  You have chest pain.  You have unexpected bleeding.  You feel that you are having a life-threatening or urgent problem. This information is not intended to replace advice given to you by your health care provider. Make sure you discuss any questions you have with your health care provider. Document Released: 10/25/2000 Document Revised: 12/22/2015 Document Reviewed: 07/03/2015 Elsevier Interactive Patient Education  [employer]2018 [employer] Inc.

## 2017-07-20 NOTE — Interval H&P Note (Signed)
History and Physical Interval Note:  07/20/2017 2:40 PM  Margaret Powell  has presented today for surgery, with the diagnosis of symptomatic biliary colic, probable chronic cholecystitis  The various methods of treatment have been discussed with the patient and family. After consideration of risks, benefits and other options for treatment, the patient has consented to  Procedure(s) with comments: LAPAROSCOPIC CHOLECYSTECTOMY SINGLE SITE WITH INTRAOPERATIVE CHOLANGIOGRAM ERAS PATHWAY (N/A) - ERAS PATHWAY as a surgical intervention .  The patient's history has been reviewed, patient examined, no change in status, stable for surgery.  I have reviewed the patient's chart and labs.  Questions were answered to the patient's satisfaction.   I have re-reviewed the the patient's records, history, medications, and allergies.  I have re-examined the patient.  I again discussed intraoperative plans and goals of post-operative recovery.  The patient agrees to proceed.  Inocencio HomesLissette Beehler  [birth year] [id number]  Patient Care Team: Patient, No Pcp Per as PCP - General (General Practice) Karie SodaGross, Adeyemi Hamad, MD as Consulting Physician (General Surgery) Sherian ReinBovard-Stuckert, Jody, MD as Consulting Physician (Obstetrics and Gynecology) Reinaldo RaddleMultani, Bhupinder, NP as Nurse Practitioner (Family Medicine)  Patient Active Problem List   Diagnosis Date Noted  . Normal pregnancy in third trimester 09/05/2016  . SVD (spontaneous vaginal delivery) 09/05/2016    Past Medical History:  Diagnosis Date  . Chronic cholecystitis   . Family history of adverse reaction to anesthesia    mother ponv  . Gallstones   . GERD (gastroesophageal reflux disease)   . Headache   . History of kidney stones    right kidney now  . Medical history non-contributory   . PONV (postoperative nausea and vomiting)    after wisdom teeth    Past Surgical History:  Procedure Laterality Date  . NO PAST SURGERIES    . UPPER GI ENDOSCOPY    .  WISDOM TOOTH EXTRACTION      Social History   Socioeconomic History  . Marital status: Married    Spouse name: Not on file  . Number of children: Not on file  . Years of education: Not on file  . Highest education level: Not on file  Social Needs  . Financial resource strain: Not on file  . Food insecurity - worry: Not on file  . Food insecurity - inability: Not on file  . Transportation needs - medical: Not on file  . Transportation needs - non-medical: Not on file  Occupational History  . Not on file  Tobacco Use  . Smoking status: Never Smoker  . Smokeless tobacco: Never Used  Substance and Sexual Activity  . Alcohol use: Yes    Comment: occ  . Drug use: No  . Sexual activity: Yes  Other Topics Concern  . Not on file  Social History Narrative  . Not on file    History reviewed. No pertinent family history.  Medications Prior to Admission  Medication Sig Dispense Refill Last Dose  . acetaminophen (TYLENOL) 500 MG tablet Take 1,000 mg by mouth every 6 (six) hours as needed for moderate pain or headache.   Past Week at Unknown time  . doxycycline (VIBRAMYCIN) 100 MG capsule Take 100 mg by mouth 2 (two) times daily.   07/19/2017 at 0800  . Levonorgestrel (SKYLA) 13.5 MG IUD 1 each by Intrauterine route once.   More than a month at Unknown time  . Prenatal Vit-Fe Fumarate-FA (MULTIVITAMIN-PRENATAL) 27-0.8 MG TABS tablet Take 1 tablet by mouth daily at 12 noon. (Patient not taking: Reported  on 07/19/2017) 100 each 3 Not Taking at Unknown time    Current Facility-Administered Medications  Medication Dose Route Frequency Provider Last Rate Last Dose  . lactated ringers infusion   Intravenous Continuous Gaynelle AduFitzgerald, William, MD 75 mL/hr at 07/20/17 1410       Allergies  Allergen Reactions  . Nsaids Rash and Other (See Comments)    blisters    BP 121/75 (BP Location: Right Arm)   Pulse 78   Temp 98 F (36.7 C) (Oral)   Resp 16   Ht 5\' 4"  (1.626 m)   Wt 64.7 kg (142  lb 9.6 oz)   LMP 04/19/2017   SpO2 100%   BMI 24.48 kg/m   Labs: Results for orders placed or performed during the hospital encounter of 07/20/17 (from the past 48 hour(s))  CBC     Status: None   Collection Time: 07/20/17  1:42 PM  Result Value Ref Range   WBC 5.4 4.0 - 10.5 K/uL   RBC 4.35 3.87 - 5.11 MIL/uL   Hemoglobin 13.8 12.0 - 15.0 g/dL   HCT 09.840.7 11.936.0 - 14.746.0 %   MCV 93.6 78.0 - 100.0 fL   MCH 31.7 26.0 - 34.0 pg   MCHC 33.9 30.0 - 36.0 g/dL   RDW 82.912.3 56.211.5 - 13.015.5 %   Platelets 274 150 - 400 K/uL  Pregnancy, urine     Status: None   Collection Time: 07/20/17  1:42 PM  Result Value Ref Range   Preg Test, Ur NEGATIVE NEGATIVE    Comment:        THE SENSITIVITY OF THIS METHODOLOGY IS >20 mIU/mL.     Imaging / Studies: No results found.   Ardeth Sportsman.Natanya Holecek C. Maysun Meditz, M.D., F.A.C.S. Gastrointestinal and Minimally Invasive Surgery [facility] [address] [place], [place] [id number] [phone] Main / Paging  07/20/2017 2:41 PM    Ardeth SportsmanSteven C Dalaney Needle

## 2017-07-21 ENCOUNTER — Encounter (HOSPITAL_COMMUNITY): Payer: Self-pay | Admitting: Surgery

## 2017-07-26 ENCOUNTER — Emergency Department (HOSPITAL_COMMUNITY)
Admission: EM | Admit: 2017-07-26 | Discharge: 2017-07-27 | Disposition: A | Discharge status: Home / Self Care | Payer: Managed Care, Other (non HMO) | Attending: Emergency Medicine | Admitting: Emergency Medicine

## 2017-07-26 ENCOUNTER — Encounter (HOSPITAL_COMMUNITY): Payer: Self-pay | Admitting: Emergency Medicine

## 2017-07-26 ENCOUNTER — Other Ambulatory Visit: Payer: Self-pay

## 2017-07-26 DIAGNOSIS — W500XXA Accidental hit or strike by another person, initial encounter: Secondary | ICD-10-CM | POA: Diagnosis not present

## 2017-07-26 DIAGNOSIS — Y9389 Activity, other specified: Secondary | ICD-10-CM | POA: Insufficient documentation

## 2017-07-26 DIAGNOSIS — S0501XA Injury of conjunctiva and corneal abrasion without foreign body, right eye, initial encounter: Secondary | ICD-10-CM | POA: Diagnosis not present

## 2017-07-26 DIAGNOSIS — Y998 Other external cause status: Secondary | ICD-10-CM | POA: Diagnosis not present

## 2017-07-26 DIAGNOSIS — Z79899 Other long term (current) drug therapy: Secondary | ICD-10-CM | POA: Insufficient documentation

## 2017-07-26 DIAGNOSIS — Y929 Unspecified place or not applicable: Secondary | ICD-10-CM | POA: Diagnosis not present

## 2017-07-26 DIAGNOSIS — S0591XA Unspecified injury of right eye and orbit, initial encounter: Secondary | ICD-10-CM | POA: Diagnosis present

## 2017-07-26 MED ORDER — TETRACAINE HCL 0.5 % OP SOLN
2.0000 [drp] | Freq: Once | OPHTHALMIC | Status: AC
  Administered 2017-07-26: 2 [drp] via OPHTHALMIC
  Filled 2017-07-26: qty 4

## 2017-07-26 MED ORDER — FLUORESCEIN SODIUM 1 MG OP STRP
1.0000 | ORAL_STRIP | Freq: Once | OPHTHALMIC | Status: AC
  Administered 2017-07-26: 1 via OPHTHALMIC
  Filled 2017-07-26: qty 1

## 2017-07-26 NOTE — ED Notes (Signed)
See provider assessment 

## 2017-07-26 NOTE — ED Triage Notes (Signed)
Pt states she put allergy drops in her eye after incident.

## 2017-07-26 NOTE — ED Triage Notes (Signed)
Pt c/o eye pain and blurred vision after her child "poked" her in the eye. Eye is red, no swelling noted.

## 2017-07-27 MED ORDER — ERYTHROMYCIN 5 MG/GM OP OINT
1.0000 "application " | TOPICAL_OINTMENT | Freq: Once | OPHTHALMIC | Status: AC
  Administered 2017-07-27: 1 via OPHTHALMIC
  Filled 2017-07-27: qty 3.5

## 2017-07-27 MED ORDER — CYCLOPENTOLATE HCL 1 % OP SOLN
1.0000 [drp] | Freq: Once | OPHTHALMIC | Status: AC
  Administered 2017-07-27: 1 [drp] via OPHTHALMIC
  Filled 2017-07-27: qty 2

## 2017-07-27 MED ORDER — HOMATROPINE HBR 2 % OP SOLN: 1.0000 [drp] | Freq: Once | OPHTHALMIC | Status: DC

## 2017-07-27 NOTE — Discharge Instructions (Addendum)
Please read instructions below. It is important that you follow-up with your ophthalmologist, tomorrow if possible.   You can administer 1 drop of the cyclopentolate to your right eye, 3 times daily for 3 days. Wait at least 5 minutes after administering this drop before administering the erythromycin. Apply 1/2 inch ribbon of erythromycin ointment to your right eye, 4 times daily for the next 5 days. Return to the ER for sudden vision loss, purulent drainage, or new or concerning symptoms.

## 2017-07-27 NOTE — ED Provider Notes (Signed)
[facility] Provider Note   CSN: [id number] Arrival date & time: 07/26/17  2208     History   Chief Complaint Chief Complaint  Patient presents with  . Eye Pain    HPI Margaret Powell is a 31 y.o. female presenting to ED with acute onset of right eye pain after being accidentally poked in the eye by her 31 year old daughter. She states she was playing with her and her finger hit her in the right eye causing immediate pain and burning. States she feels as though there is something in her eye.  Pain is made worse with blinking and opening her eye.  States she irrigated her eye immediately following the incident.  She also applied Visine allergic drops which caused her eye to burn more.  Does not wear contact lenses. Tdap up to date.   The history is provided by the patient.    Past Medical History:  Diagnosis Date  . Chronic cholecystitis   . Family history of adverse reaction to anesthesia    mother ponv  . Gallstones   . GERD (gastroesophageal reflux disease)   . Headache   . History of kidney stones    right kidney now  . Medical history non-contributory   . PONV (postoperative nausea and vomiting)    after wisdom teeth    Patient Active Problem List   Diagnosis Date Noted  . Chronic cholecystitis with calculus s/p lap cholecystectomy 07/20/2017 07/20/2017  . Normal pregnancy in third trimester 09/05/2016  . SVD (spontaneous vaginal delivery) 09/05/2016    Past Surgical History:  Procedure Laterality Date  . LAPAROSCOPIC CHOLECYSTECTOMY SINGLE SITE WITH INTRAOPERATIVE CHOLANGIOGRAM N/A 07/20/2017   Procedure: LAPAROSCOPIC CHOLECYSTECTOMY SINGLE SITE WITH INTRAOPERATIVE CHOLANGIOGRAM ERAS PATHWAY;  Surgeon: Karie SodaGross, Steven, MD;  Location: [facility];  Service: General;  Laterality: N/A;  ERAS PATHWAY  . NO PAST SURGERIES    . UPPER GI ENDOSCOPY    . WISDOM TOOTH EXTRACTION      OB History    Gravida Para Term Preterm AB Living     4 2 1   2 2    SAB TAB Ectopic Multiple Live Births   2     0 2       Home Medications    Prior to Admission medications   Medication Sig Start Date End Date Taking? Authorizing Provider  acetaminophen (TYLENOL) 500 MG tablet Take 1,000 mg by mouth every 6 (six) hours as needed for moderate pain or headache.    [provider]  doxycycline (VIBRAMYCIN) 100 MG capsule Take 100 mg by mouth 2 (two) times daily. 07/12/17   [provider]  Levonorgestrel (SKYLA) 13.5 MG IUD 1 each by Intrauterine route once.    [provider]  Prenatal Vit-Fe Fumarate-FA (MULTIVITAMIN-PRENATAL) 27-0.8 MG TABS tablet Take 1 tablet by mouth daily at 12 noon. Patient not taking: Reported on 07/19/2017 09/07/16   Bovard-Stuckert, Augusto GambleJody, MD  traMADol (ULTRAM) 50 MG tablet Take 1-2 tablets (50-100 mg total) by mouth every 6 (six) hours as needed for moderate pain or severe pain. 07/20/17   Karie SodaGross, Steven, MD    Family History No family history on file.  Social History Social History   Tobacco Use  . Smoking status: Never Smoker  . Smokeless tobacco: Never Used  Substance Use Topics  . Alcohol use: Yes    Comment: occ  . Drug use: No     Allergies   Nsaids   Review  of Systems Review of Systems  Eyes: Positive for pain and redness.  Neurological: Negative for headaches.     Physical Exam Updated Vital Signs BP 124/85   Pulse 86   Temp 98.6 F (37 C) (Oral)   Resp 18   Ht 5\' 4"  (1.626 m)   Wt 64.4 kg (142 lb)   SpO2 98%   BMI 24.37 kg/m   Physical Exam  Constitutional: She appears well-developed and well-nourished.  HENT:  Head: Normocephalic and atraumatic.  Eyes: EOM are normal. Pupils are equal, round, and reactive to light. Lids are everted and swept, no foreign bodies found.    Right conjunctive mildly injected.  PERRL, no consensual photophobia.  Eye visualized under Woods lamp with fluorescein stain; patch of uptake noted about 6 o'clock, no seidel  sign.  No foreign body visualized.  Cardiovascular: Intact distal pulses.  Pulmonary/Chest: Effort normal.  Psychiatric: She has a normal mood and affect. Her behavior is normal.  Nursing note and vitals reviewed.    ED Treatments / Results  Labs (all labs ordered are listed, but only abnormal results are displayed) Labs Reviewed - No data to display  EKG  EKG Interpretation None       Radiology No results found.  Procedures Procedures (including critical care time)  Medications Ordered in ED Medications  fluorescein ophthalmic strip 1 strip (1 strip Left Eye Given 07/26/17 2355)  tetracaine (PONTOCAINE) 0.5 % ophthalmic solution 2 drop (2 drops Left Eye Given 07/26/17 2355)  erythromycin ophthalmic ointment 1 application (1 application Right Eye Given 07/27/17 0103)  cyclopentolate (CYCLODRYL,CYCLOGYL) 1 % ophthalmic solution 1 drop (1 drop Right Eye Given 07/27/17 0055)     Initial Impression / Assessment and Plan / ED Course  I have reviewed the triage vital signs and the nursing notes.  Pertinent labs & imaging results that were available during my care of the patient were reviewed by me and considered in my medical decision making (see chart for details).  Clinical Course as of Jul 28 111  Wed Jul 27, 2017  0052 Discussed cycloplegic with pharmacist for pain management, recommends similar action to homatroprine.  Will administer here and sent with bottle for use at home until follow-up with ophthalmology.  [JR]    Clinical Course User Index [JR] Cathline Dowen, [place] N, PA-C    Pt with HPI and exam consistent with corneal abrasion. Tdap UTD. No evidence of FB.  No change in vision.  Pt is not a contact lens wearer.  Exam non-concerning for orbital cellulitis, hyphema, corneal ulcers. Patient will be discharged home with erythromycin, and cycloplegic for pain.   Patient understands to follow up with ophthalmology, & to return to ER if new symptoms develop including  change in vision, purulent drainage, or entrapment.  Patient discussed with Dr. Clarene DukeLittle, who agrees with care plan.  Discussed results, findings, treatment and follow up. Patient advised of return precautions. Patient verbalized understanding and agreed with plan.  Final Clinical Impressions(s) / ED Diagnoses   Final diagnoses:  Abrasion of right cornea, initial encounter    ED Discharge Orders    None       Detrell Umscheid, [place] N, PA-C 07/27/17 0112    Little, Ambrose Finlandachel Morgan, MD 07/27/17 [phone]

## 2017-07-27 NOTE — ED Notes (Signed)
Pt unable to sign cannot open eyes states understanding d/c instructions

## 2019-05-04 IMAGING — RF DG CHOLANGIOGRAM OPERATIVE
1 series · 4 of 4 positions shown · non-contrast
Comparison: None

CLINICAL DATA: Intraoperative cholangiogram during laparoscopic
cholecystectomy.

EXAM:
INTRAOPERATIVE CHOLANGIOGRAM
FLUOROSCOPY TIME:  6 seconds

[Series 1: run · 4 of 31 frames shown]
[frame 5/31]
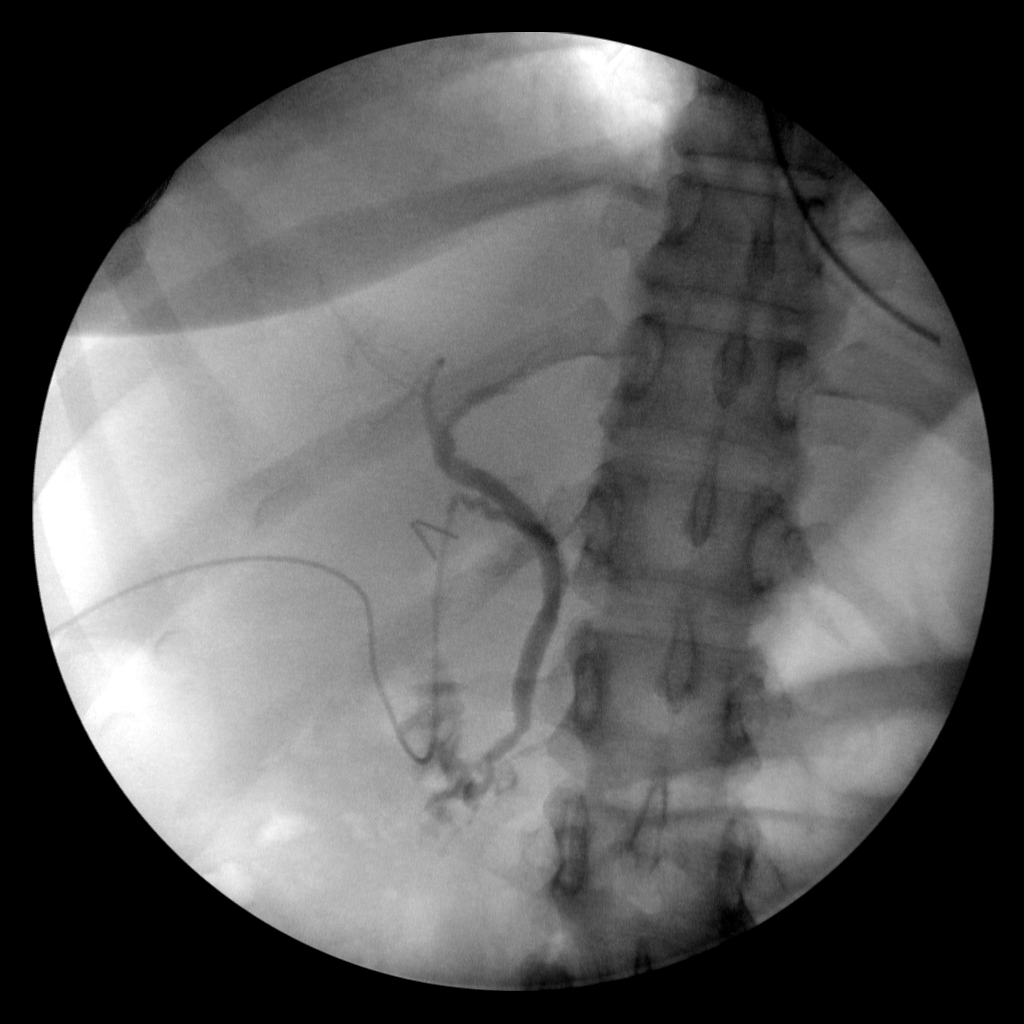
[frame 12/31]
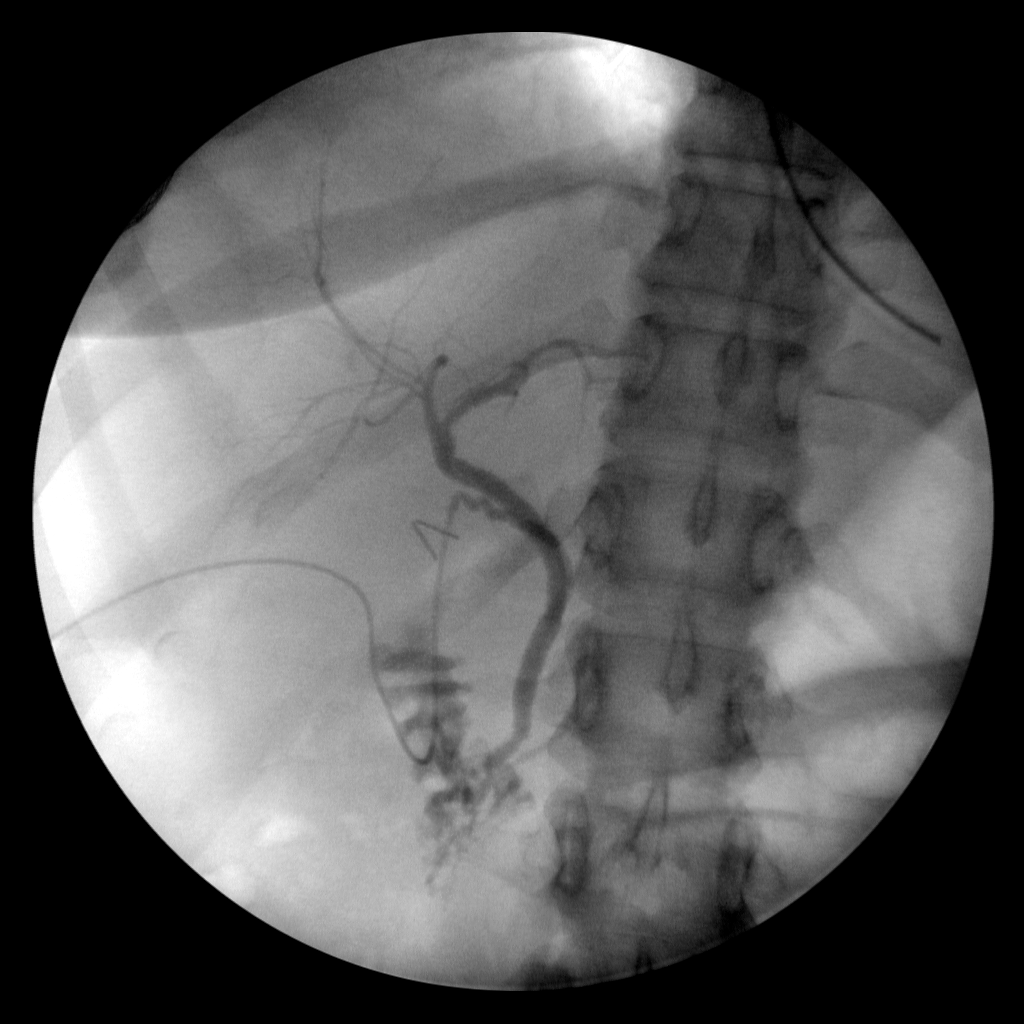
[frame 16/31]
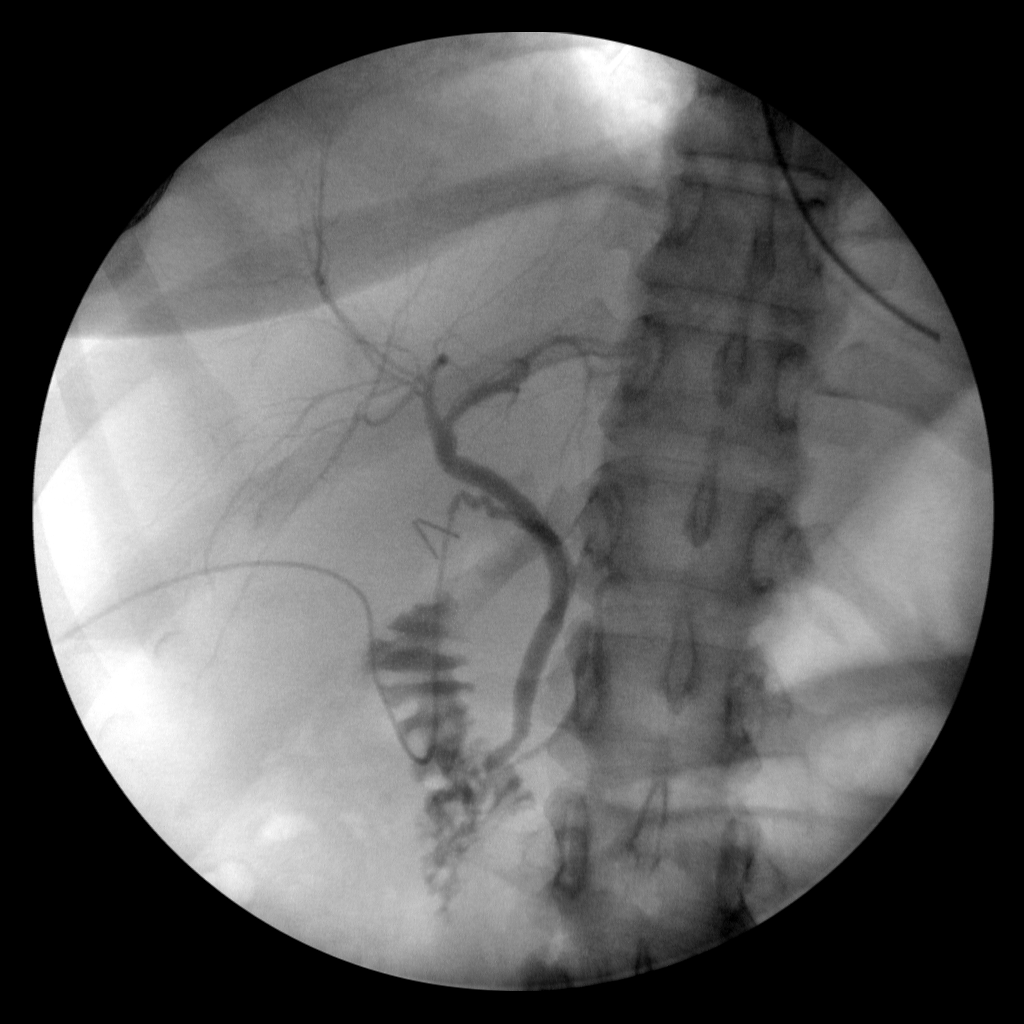
[frame 27/31]
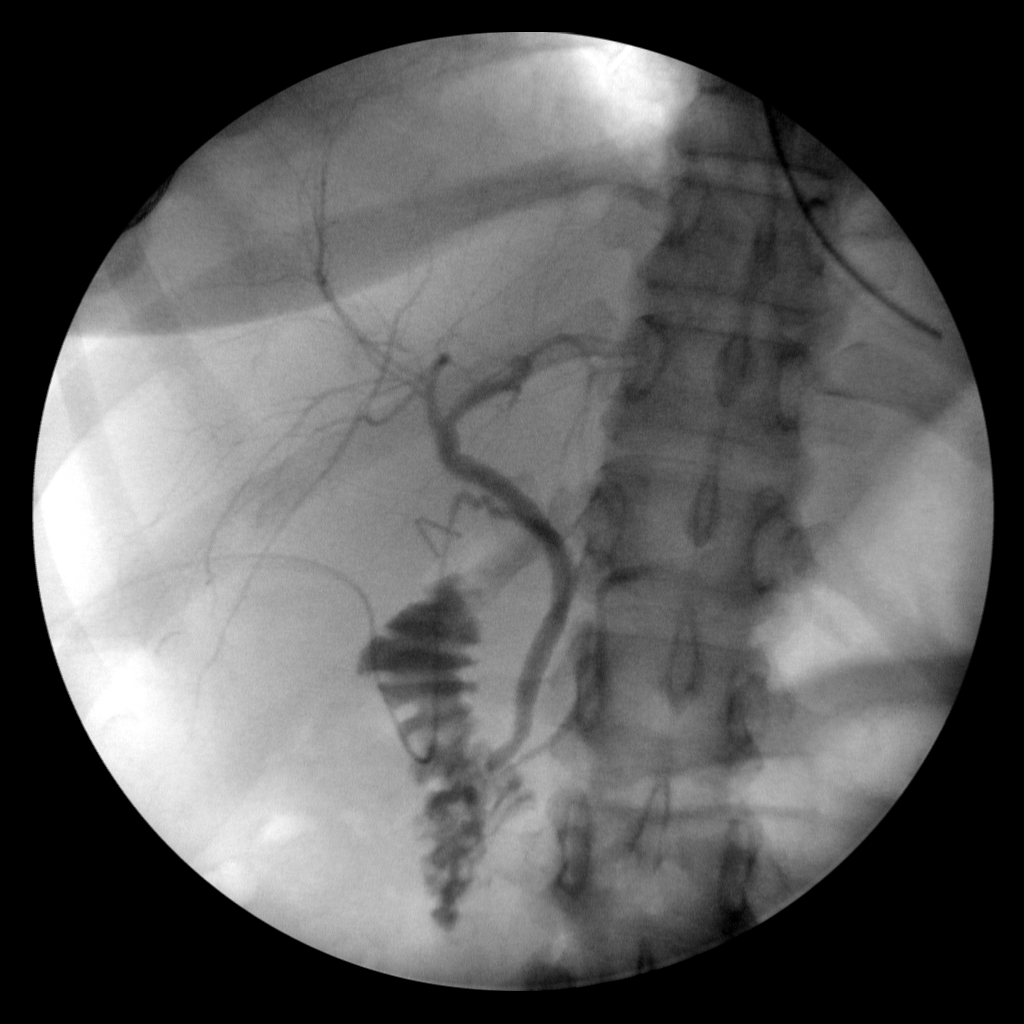

[4 of 4 positions shown; findings below may reference images not displayed]

FINDINGS: Intraoperative cholangiographic images of the right upper abdominal
quadrant during laparoscopic cholecystectomy are provided for
review.

Surgical clips overlie the expected location of the gallbladder
fossa.

Contrast injection demonstrates selective cannulation of the central
aspect of the cystic duct.

There is passage of contrast through the central aspect of the
cystic duct with filling of a non dilated common bile duct. There is
passage of contrast though the CBD and into the descending portion
of the duodenum.

There is minimal reflux of injected contrast into the common hepatic
duct and central aspect of the non dilated intrahepatic biliary
system. There is minimal opacification the central aspect the
pancreatic duct which appears nondilated.

There are no discrete filling defects within the opacified portions
of the biliary system to suggest the presence of
choledocholithiasis.
IMPRESSION: No evidence of choledocholithiasis.

## 2019-06-07 ENCOUNTER — Other Ambulatory Visit (HOSPITAL_COMMUNITY)
Admission: RE | Admit: 2019-06-07 | Discharge: 2019-06-07 | Disposition: A | Discharge status: Home / Self Care | Payer: 59 | Source: Ambulatory Visit | Attending: Obstetrics and Gynecology | Admitting: Obstetrics and Gynecology

## 2019-06-07 DIAGNOSIS — Z01812 Encounter for preprocedural laboratory examination: Secondary | ICD-10-CM | POA: Insufficient documentation

## 2019-06-07 DIAGNOSIS — Z20828 Contact with and (suspected) exposure to other viral communicable diseases: Secondary | ICD-10-CM | POA: Insufficient documentation

## 2019-06-09 LAB — NOVEL CORONAVIRUS, NAA (HOSP ORDER, SEND-OUT TO REF LAB; TAT 18-24 HRS): SARS-CoV-2, NAA: NOT DETECTED

## 2019-06-11 ENCOUNTER — Emergency Department (HOSPITAL_COMMUNITY)
Admission: EM | Admit: 2019-06-11 | Discharge: 2019-06-11 | Disposition: A | Discharge status: Home / Self Care | Payer: 59 | Attending: Emergency Medicine | Admitting: Emergency Medicine

## 2019-06-11 DIAGNOSIS — R10817 Generalized abdominal tenderness: Secondary | ICD-10-CM | POA: Diagnosis not present

## 2019-06-11 DIAGNOSIS — R112 Nausea with vomiting, unspecified: Secondary | ICD-10-CM | POA: Insufficient documentation

## 2019-06-11 DIAGNOSIS — R197 Diarrhea, unspecified: Secondary | ICD-10-CM | POA: Diagnosis not present

## 2019-06-11 DIAGNOSIS — Z9889 Other specified postprocedural states: Secondary | ICD-10-CM | POA: Diagnosis not present

## 2019-06-11 DIAGNOSIS — R1110 Vomiting, unspecified: Secondary | ICD-10-CM

## 2019-06-11 LAB — COMPREHENSIVE METABOLIC PANEL
ALT: 16 U/L (ref 0–44)
AST: 24 U/L (ref 15–41)
Albumin: 4.1 g/dL (ref 3.5–5.0)
Alkaline Phosphatase: 52 U/L (ref 38–126)
Anion gap: 13 (ref 5–15)
BUN: 10 mg/dL (ref 6–20)
CO2: 19 mmol/L — ABNORMAL LOW (ref 22–32)
Calcium: 9.3 mg/dL (ref 8.9–10.3)
Chloride: 105 mmol/L (ref 98–111)
Creatinine, Ser: 0.72 mg/dL (ref 0.44–1.00)
GFR calc Af Amer: >60 mL/min (ref >60)
GFR calc non Af Amer: >60 mL/min (ref >60)
Glucose, Bld: 141 mg/dL — ABNORMAL HIGH (ref 70–99)
Potassium: 3.5 mmol/L (ref 3.5–5.1)
Sodium: 137 mmol/L (ref 135–145)
Total Bilirubin: 0.6 mg/dL (ref 0.3–1.2)
Total Protein: 7.6 g/dL (ref 6.5–8.1)

## 2019-06-11 LAB — I-STAT BETA HCG BLOOD, ED (MC, WL, AP ONLY): I-stat hCG, quantitative: <5 m[IU]/mL (ref <5)

## 2019-06-11 LAB — CBC
HCT: 43.4 % (ref 36.0–46.0)
Hemoglobin: 14.8 g/dL (ref 12.0–15.0)
MCH: 32.4 pg (ref 26.0–34.0)
MCHC: 34.1 g/dL (ref 30.0–36.0)
MCV: 95 fL (ref 80.0–100.0)
Platelets: 292 10*3/uL (ref 150–400)
RBC: 4.57 MIL/uL (ref 3.87–5.11)
RDW: 11.9 % (ref 11.5–15.5)
WBC: 11.6 10*3/uL — ABNORMAL HIGH (ref 4.0–10.5)
nRBC: 0 % (ref 0.0–0.2)

## 2019-06-11 LAB — LIPASE, BLOOD: Lipase: 33 U/L (ref 11–51)

## 2019-06-11 MED ORDER — MORPHINE SULFATE (PF) 4 MG/ML IV SOLN
4.0000 mg | Freq: Once | INTRAVENOUS | Status: AC
  Administered 2019-06-11: 4 mg via INTRAVENOUS
  Filled 2019-06-11: qty 1

## 2019-06-11 MED ORDER — ONDANSETRON 4 MG PO TBDP
ORAL_TABLET | ORAL | Status: AC
  Filled 2019-06-11: qty 1

## 2019-06-11 MED ORDER — SODIUM CHLORIDE 0.9% FLUSH: 3.0000 mL | Freq: Once | INTRAVENOUS | Status: DC

## 2019-06-11 MED ORDER — ONDANSETRON 4 MG PO TBDP: 4.0000 mg | ORAL_TABLET | Freq: Once | ORAL | Status: DC | PRN

## 2019-06-11 MED ORDER — SODIUM CHLORIDE 0.9 % IV BOLUS
1000.0000 mL | Freq: Once | INTRAVENOUS | Status: AC
  Administered 2019-06-11: 18:00:00 1000 mL via INTRAVENOUS

## 2019-06-11 MED ORDER — PROMETHAZINE HCL 25 MG RE SUPP: 25.0000 mg | Freq: Four times a day (QID) | RECTAL | 0 refills | Status: DC | PRN

## 2019-06-11 MED ORDER — ONDANSETRON HCL 4 MG/2ML IJ SOLN
4.0000 mg | Freq: Once | INTRAMUSCULAR | Status: AC
  Administered 2019-06-11: 4 mg via INTRAVENOUS
  Filled 2019-06-11: qty 2

## 2019-06-11 NOTE — ED Triage Notes (Signed)
Pt just had ablation today and after getting home has had constant nausea and emesis

## 2019-06-11 NOTE — ED Provider Notes (Signed)
[facility] EMERGENCY DEPARTMENT Provider Note   CSN: [id number] Arrival date & time: 06/11/19  1420     History   Chief Complaint Chief Complaint  Patient presents with  . Emesis    HPI Margaret Powell is a 33 y.o. female with history of GERD who presents with nausea, vomiting, and diarrhea after endometrial ablation today.  Patient reports she came home and ate breakfast and took a hydrocodone and began vomiting after that.  She has not been able to stop.  She reports pain in her abdomen that is mostly present with vomiting.  She also reports soreness in her lower abdomen.  She has had some vaginal bleeding, which she was told was expected following procedure.  She denies any urinary symptoms, chest pain, shortness of breath, fever.     HPI  Past Medical History:  Diagnosis Date  . Chronic cholecystitis   . Family history of adverse reaction to anesthesia    mother ponv  . Gallstones   . GERD (gastroesophageal reflux disease)   . Headache   . History of kidney stones    right kidney now  . Medical history non-contributory   . PONV (postoperative nausea and vomiting)    after wisdom teeth    Patient Active Problem List   Diagnosis Date Noted  . Chronic cholecystitis with calculus s/p lap cholecystectomy 07/20/2017 07/20/2017  . Normal pregnancy in third trimester 09/05/2016  . SVD (spontaneous vaginal delivery) 09/05/2016    Past Surgical History:  Procedure Laterality Date  . LAPAROSCOPIC CHOLECYSTECTOMY SINGLE SITE WITH INTRAOPERATIVE CHOLANGIOGRAM N/A 07/20/2017   Procedure: LAPAROSCOPIC CHOLECYSTECTOMY SINGLE SITE WITH INTRAOPERATIVE CHOLANGIOGRAM ERAS PATHWAY;  Surgeon: Karie Soda, MD;  Location: [facility];  Service: General;  Laterality: N/A;  ERAS PATHWAY  . NO PAST SURGERIES    . UPPER GI ENDOSCOPY    . WISDOM TOOTH EXTRACTION       OB History    Gravida  4   Para  2   Term  1   Preterm      AB  2   Living  2     SAB  2    TAB      Ectopic      Multiple  0   Live Births  2            Home Medications    Prior to Admission medications   Medication Sig Start Date End Date Taking? Authorizing Provider  acetaminophen (TYLENOL) 500 MG tablet Take 1,000 mg by mouth every 6 (six) hours as needed for moderate pain or headache.    [provider]  doxycycline (VIBRAMYCIN) 100 MG capsule Take 100 mg by mouth 2 (two) times daily. 07/12/17   [provider]  Levonorgestrel (SKYLA) 13.5 MG IUD 1 each by Intrauterine route once.    [provider]  Prenatal Vit-Fe Fumarate-FA (MULTIVITAMIN-PRENATAL) 27-0.8 MG TABS tablet Take 1 tablet by mouth daily at 12 noon. Patient not taking: Reported on 07/19/2017 09/07/16   Sherian Rein, MD  promethazine (PHENERGAN) 25 MG suppository Place 1 suppository (25 mg total) rectally every 6 (six) hours as needed for nausea or vomiting. 06/11/19   Kathren Scearce, Waylan Boga, PA-C  traMADol (ULTRAM) 50 MG tablet Take 1-2 tablets (50-100 mg total) by mouth every 6 (six) hours as needed for moderate pain or severe pain. 07/20/17   Karie Soda, MD    Family History No family history on file.  Social History Social History  Tobacco Use  . Smoking status: Never Smoker  . Smokeless tobacco: Never Used  Substance Use Topics  . Alcohol use: Yes    Comment: occ  . Drug use: No     Allergies   Nsaids   Review of Systems Review of Systems  Constitutional: Negative for chills and fever.  HENT: Negative for facial swelling and sore throat.   Respiratory: Negative for shortness of breath.   Cardiovascular: Negative for chest pain.  Gastrointestinal: Positive for abdominal pain, diarrhea, nausea and vomiting.  Genitourinary: Positive for vaginal bleeding. Negative for dysuria.  Musculoskeletal: Negative for back pain.  Skin: Negative for rash and wound.  Neurological: Negative for headaches.  Psychiatric/Behavioral: The patient is not  nervous/anxious.      Physical Exam Updated Vital Signs BP 140/60 (BP Location: Right Arm)   Pulse 60   Temp 98 F (36.7 C) (Oral)   Resp 16   SpO2 99%   Physical Exam Vitals signs and nursing note reviewed.  Constitutional:      General: She is not in acute distress.    Appearance: She is well-developed. She is not diaphoretic.  HENT:     Head: Normocephalic and atraumatic.     Mouth/Throat:     Pharynx: No oropharyngeal exudate.  Eyes:     General: No scleral icterus.       Right eye: No discharge.        Left eye: No discharge.     Conjunctiva/sclera: Conjunctivae normal.     Pupils: Pupils are equal, round, and reactive to light.  Neck:     Musculoskeletal: Normal range of motion and neck supple.     Thyroid: No thyromegaly.  Cardiovascular:     Rate and Rhythm: Normal rate and regular rhythm.     Heart sounds: Normal heart sounds. No murmur. No friction rub. No gallop.   Pulmonary:     Effort: Pulmonary effort is normal. No respiratory distress.     Breath sounds: Normal breath sounds. No stridor. No wheezing or rales.  Abdominal:     General: Bowel sounds are normal. There is no distension.     Palpations: Abdomen is soft.     Tenderness: There is abdominal tenderness. There is no guarding or rebound.     Comments: Generalized mild tenderness, nonfocal  Lymphadenopathy:     Cervical: No cervical adenopathy.  Skin:    General: Skin is warm and dry.     Coloration: Skin is not pale.     Findings: No rash.  Neurological:     Mental Status: She is alert.     Coordination: Coordination normal.      ED Treatments / Results  Labs (all labs ordered are listed, but only abnormal results are displayed) Labs Reviewed  COMPREHENSIVE METABOLIC PANEL - Abnormal; Notable for the following components:      Result Value   CO2 19 (*)    Glucose, Bld 141 (*)    All other components within normal limits  CBC - Abnormal; Notable for the following components:   WBC  11.6 (*)    All other components within normal limits  LIPASE, BLOOD  URINALYSIS, ROUTINE W REFLEX MICROSCOPIC  PREGNANCY, URINE  I-STAT BETA HCG BLOOD, ED ([facility], [facility], [facility] ONLY)    EKG None  Radiology No results found.  Procedures Procedures (including critical care time)  Medications Ordered in ED Medications  sodium chloride flush (NS) 0.9 % injection 3 mL (has no administration in time  range)  ondansetron (ZOFRAN-ODT) disintegrating tablet 4 mg (has no administration in time range)  ondansetron (ZOFRAN-ODT) 4 MG disintegrating tablet (has no administration in time range)  sodium chloride 0.9 % bolus 1,000 mL (0 mLs Intravenous Stopped 06/11/19 1928)  ondansetron (ZOFRAN) injection 4 mg (4 mg Intravenous Given 06/11/19 1745)  morphine 4 MG/ML injection 4 mg (4 mg Intravenous Given 06/11/19 1746)     Initial Impression / Assessment and Plan / ED Course  I have reviewed the triage vital signs and the nursing notes.  Pertinent labs & imaging results that were available during my care of the patient were reviewed by me and considered in my medical decision making (see chart for details).        Patient with nausea, vomiting, and diarrhea after endometrial ablation today. She reports it started after eating breakfast and taking hydrocodone after the surgery. Labs are unremarkable and her abdominal exam is nonfocal and completely nontender after antiemetic and pain control in the ED. IV fluids given. She is tolerating oral fluids. Suspect post anesthesia emesis vs adverse reaction to hydrocodone. Close follow up with OB/GYN as planned. Patient requests a suppository for home so will discharge home with phenergan. Strict return precautions discussed including, severe, focal abdominal pain, intractable vomiting, increasing vaginal bleeding, or any other new or concerning symptoms. She understands and agrees with plan. Patient vitals stable throughout ED course and discharged in satisfactory  condition.  Final Clinical Impressions(s) / ED Diagnoses   Final diagnoses:  Severe vomiting following anesthesia    ED Discharge Orders         Ordered    promethazine (PHENERGAN) 25 MG suppository  Every 6 hours PRN     06/11/19 1855           Frederica Kuster, PA-C 06/11/19 2110    Carmin Muskrat, MD 06/11/19 2156

## 2019-06-11 NOTE — ED Notes (Signed)
Patient verbalizes understanding of discharge instructions. Opportunity for questioning and answers were provided. Armband removed by staff, pt discharged from ED.  

## 2019-06-11 NOTE — Discharge Instructions (Signed)
Use Phenergan suppository as prescribed, as needed for nausea or vomiting.  Please follow-up with your OB/GYN for your postoperative visit.  Please return the emergency department if you develop any severe pain, increasing bleeding, intractable vomiting, or any other concerning symptoms.

## 2019-11-08 ENCOUNTER — Ambulatory Visit: Payer: 59

## 2019-11-08 ENCOUNTER — Ambulatory Visit: Payer: Self-pay | Attending: Internal Medicine

## 2019-11-08 DIAGNOSIS — Z23 Encounter for immunization: Secondary | ICD-10-CM

## 2019-11-08 NOTE — Progress Notes (Signed)
   Covid-19 Vaccination Clinic  Name:  Nefertari Rebman    MRN: [id number] DOB: Nov 12, 1985  11/08/2019  Ms. Streb was observed post Covid-19 immunization for 15 minutes without incident. She was provided with Vaccine Information Sheet and instruction to access the V-Safe system.   Ms. Casad was instructed to call 911 with any severe reactions post vaccine: Marland Kitchen Difficulty breathing  . Swelling of face and throat  . A fast heartbeat  . A bad rash all over body  . Dizziness and weakness   Immunizations Administered    Name Date Dose VIS Date Route   Pfizer COVID-19 Vaccine 11/08/2019  3:01 PM 0.3 mL 07/13/2019 Intramuscular   Manufacturer: [employer], [employer]   Lot: [id number]   NDC: [address]-4

## 2019-12-03 ENCOUNTER — Ambulatory Visit: Payer: Self-pay

## 2019-12-17 ENCOUNTER — Ambulatory Visit: Payer: Self-pay | Attending: Internal Medicine

## 2019-12-17 DIAGNOSIS — Z23 Encounter for immunization: Secondary | ICD-10-CM

## 2019-12-17 NOTE — Progress Notes (Signed)
   Covid-19 Vaccination Clinic  Name:  Margaret Powell    MRN: [id number] DOB: January 18, 1986  12/17/2019  Margaret Powell was observed post Covid-19 immunization for 15 minutes without incident. She was provided with Vaccine Information Sheet and instruction to access the V-Safe system.   Margaret Powell was instructed to call 911 with any severe reactions post vaccine: Marland Kitchen Difficulty breathing  . Swelling of face and throat  . A fast heartbeat  . A bad rash all over body  . Dizziness and weakness   Immunizations Administered    Name Date Dose VIS Date Route   Pfizer COVID-19 Vaccine 12/17/2019  1:30 PM 0.3 mL 09/26/2018 Intramuscular   Manufacturer: [employer], [employer]   Lot: [id number]   NDC: [address]-7

## 2020-09-19 DIAGNOSIS — F419 Anxiety disorder, unspecified: Secondary | ICD-10-CM | POA: Diagnosis not present

## 2020-11-28 DIAGNOSIS — F4320 Adjustment disorder, unspecified: Secondary | ICD-10-CM | POA: Diagnosis not present

## 2021-01-01 DIAGNOSIS — F4320 Adjustment disorder, unspecified: Secondary | ICD-10-CM | POA: Diagnosis not present

## 2021-02-05 DIAGNOSIS — K582 Mixed irritable bowel syndrome: Secondary | ICD-10-CM | POA: Diagnosis not present

## 2021-02-05 DIAGNOSIS — K219 Gastro-esophageal reflux disease without esophagitis: Secondary | ICD-10-CM | POA: Diagnosis not present

## 2021-02-05 DIAGNOSIS — R140 Abdominal distension (gaseous): Secondary | ICD-10-CM | POA: Diagnosis not present

## 2021-05-25 DIAGNOSIS — Z1389 Encounter for screening for other disorder: Secondary | ICD-10-CM | POA: Diagnosis not present

## 2021-05-25 DIAGNOSIS — Z113 Encounter for screening for infections with a predominantly sexual mode of transmission: Secondary | ICD-10-CM | POA: Diagnosis not present

## 2021-05-25 DIAGNOSIS — Z130 Encounter for screening for diseases of the blood and blood-forming organs and certain disorders involving the immune mechanism: Secondary | ICD-10-CM | POA: Diagnosis not present

## 2021-05-25 DIAGNOSIS — Z01419 Encounter for gynecological examination (general) (routine) without abnormal findings: Secondary | ICD-10-CM | POA: Diagnosis not present

## 2021-05-25 DIAGNOSIS — N930 Postcoital and contact bleeding: Secondary | ICD-10-CM | POA: Diagnosis not present

## 2021-05-25 DIAGNOSIS — N951 Menopausal and female climacteric states: Secondary | ICD-10-CM | POA: Diagnosis not present

## 2021-05-25 DIAGNOSIS — Z6825 Body mass index (BMI) 25.0-25.9, adult: Secondary | ICD-10-CM | POA: Diagnosis not present

## 2021-06-03 DIAGNOSIS — N939 Abnormal uterine and vaginal bleeding, unspecified: Secondary | ICD-10-CM | POA: Diagnosis not present

## 2021-06-18 DIAGNOSIS — R059 Cough, unspecified: Secondary | ICD-10-CM | POA: Diagnosis not present

## 2021-06-18 DIAGNOSIS — R5383 Other fatigue: Secondary | ICD-10-CM | POA: Diagnosis not present

## 2021-06-18 DIAGNOSIS — J069 Acute upper respiratory infection, unspecified: Secondary | ICD-10-CM | POA: Diagnosis not present

## 2021-06-18 DIAGNOSIS — Z03818 Encounter for observation for suspected exposure to other biological agents ruled out: Secondary | ICD-10-CM | POA: Diagnosis not present

## 2021-08-05 DIAGNOSIS — E7800 Pure hypercholesterolemia, unspecified: Secondary | ICD-10-CM | POA: Diagnosis not present

## 2021-08-05 DIAGNOSIS — N951 Menopausal and female climacteric states: Secondary | ICD-10-CM | POA: Diagnosis not present

## 2021-08-05 DIAGNOSIS — R5383 Other fatigue: Secondary | ICD-10-CM | POA: Diagnosis not present

## 2021-08-05 DIAGNOSIS — R635 Abnormal weight gain: Secondary | ICD-10-CM | POA: Diagnosis not present

## 2021-08-06 DIAGNOSIS — Z0000 Encounter for general adult medical examination without abnormal findings: Secondary | ICD-10-CM | POA: Diagnosis not present

## 2021-08-10 DIAGNOSIS — Z6824 Body mass index (BMI) 24.0-24.9, adult: Secondary | ICD-10-CM | POA: Diagnosis not present

## 2021-08-10 DIAGNOSIS — N951 Menopausal and female climacteric states: Secondary | ICD-10-CM | POA: Diagnosis not present

## 2021-08-10 DIAGNOSIS — E7800 Pure hypercholesterolemia, unspecified: Secondary | ICD-10-CM | POA: Diagnosis not present

## 2021-08-10 DIAGNOSIS — Z1339 Encounter for screening examination for other mental health and behavioral disorders: Secondary | ICD-10-CM | POA: Diagnosis not present

## 2021-08-10 DIAGNOSIS — Z1331 Encounter for screening for depression: Secondary | ICD-10-CM | POA: Diagnosis not present

## 2021-08-10 DIAGNOSIS — R635 Abnormal weight gain: Secondary | ICD-10-CM | POA: Diagnosis not present

## 2021-08-14 DIAGNOSIS — F4320 Adjustment disorder, unspecified: Secondary | ICD-10-CM | POA: Diagnosis not present

## 2021-09-23 DIAGNOSIS — F4320 Adjustment disorder, unspecified: Secondary | ICD-10-CM | POA: Diagnosis not present

## 2021-10-21 DIAGNOSIS — F4320 Adjustment disorder, unspecified: Secondary | ICD-10-CM | POA: Diagnosis not present

## 2022-02-16 DIAGNOSIS — F4320 Adjustment disorder, unspecified: Secondary | ICD-10-CM | POA: Diagnosis not present

## 2022-05-21 DIAGNOSIS — R002 Palpitations: Secondary | ICD-10-CM | POA: Diagnosis not present

## 2022-06-14 ENCOUNTER — Ambulatory Visit: Payer: BC Managed Care – PPO | Admitting: Internal Medicine

## 2022-06-17 DIAGNOSIS — K581 Irritable bowel syndrome with constipation: Secondary | ICD-10-CM | POA: Diagnosis not present

## 2022-06-17 DIAGNOSIS — R1013 Epigastric pain: Secondary | ICD-10-CM | POA: Diagnosis not present

## 2022-06-17 DIAGNOSIS — K219 Gastro-esophageal reflux disease without esophagitis: Secondary | ICD-10-CM | POA: Diagnosis not present

## 2022-06-17 DIAGNOSIS — R140 Abdominal distension (gaseous): Secondary | ICD-10-CM | POA: Diagnosis not present

## 2022-07-02 ENCOUNTER — Ambulatory Visit: Payer: BC Managed Care – PPO | Admitting: Internal Medicine

## 2022-07-02 ENCOUNTER — Encounter: Payer: Self-pay | Admitting: Internal Medicine

## 2022-07-02 VITALS — BP 121/78 | HR 80 | Ht 64.0 in | Wt 142.4 lb

## 2022-07-02 DIAGNOSIS — R002 Palpitations: Secondary | ICD-10-CM | POA: Insufficient documentation

## 2022-07-02 DIAGNOSIS — F419 Anxiety disorder, unspecified: Secondary | ICD-10-CM | POA: Diagnosis not present

## 2022-07-02 MED ORDER — PROPRANOLOL HCL 10 MG PO TABS: 10.0000 mg | ORAL_TABLET | Freq: Three times a day (TID) | ORAL | 6 refills | Status: AC

## 2022-07-02 NOTE — Progress Notes (Signed)
Primary Physician/Referring:  Marda Stalker, PA-C  Patient ID: Margaret Powell, female    DOB: Jun 30, 1986, 36 y.o.   MRN: [id number]  Chief Complaint  Patient presents with   Palpitations   New Patient (Initial Visit)   HPI:    Margaret Powell  is a 36 y.o. female with past medical history significant for anxiety and panic attacks who is here to establish care with cardiology.  Patient has a longstanding history of palpitations throughout her life but recently she had a few episodes that felt more severe than her usual anxiety.  Patient does admit that she takes preworkout supplement but she is only taking half the dose.  Since she is having palpitations for so long she would probably benefit from avoiding these types of supplements altogether.  She did notice that the worst episode of her palpitations was when she had a preworkout and a glass of wine later on that night.  She states she woke up in the middle of the night with really severe palpitations.  Patient is agreeable to trying propranolol for this and if symptoms do not improve or become more severe we will proceed with event monitor.  Patient denies diaphoresis, syncope, orthopnea, edema, PND, claudication.  Past Medical History:  Diagnosis Date   Chronic cholecystitis    Family history of adverse reaction to anesthesia    mother ponv   Gallstones    GERD (gastroesophageal reflux disease)    Headache    History of kidney stones    right kidney now   Medical history non-contributory    PONV (postoperative nausea and vomiting)    after wisdom teeth   Past Surgical History:  Procedure Laterality Date   LAPAROSCOPIC CHOLECYSTECTOMY SINGLE SITE WITH INTRAOPERATIVE CHOLANGIOGRAM N/A 07/20/2017   Procedure: LAPAROSCOPIC CHOLECYSTECTOMY SINGLE SITE WITH INTRAOPERATIVE CHOLANGIOGRAM ERAS PATHWAY;  Surgeon: Michael Boston, MD;  Location: [facility];  Service: General;  Laterality: N/A;  ERAS PATHWAY   NO PAST SURGERIES      UPPER GI ENDOSCOPY     WISDOM TOOTH EXTRACTION     Family History  Problem Relation Age of Onset   Hypertension Mother    Pulmonary Hypertension Mother    Hypertension Maternal Grandmother    COPD Maternal Grandfather    Diabetes Maternal Grandfather    Stroke Paternal Grandmother     Social History   Tobacco Use   Smoking status: Never   Smokeless tobacco: Never  Substance Use Topics   Alcohol use: Yes    Comment: occ   Marital Status: Married  ROS  Review of Systems  Cardiovascular:  Positive for chest pain, irregular heartbeat and palpitations.   Objective  Blood pressure 121/78, pulse 80, height 5\' 4"  (1.626 m), weight 142 lb 6.4 oz (64.6 kg), SpO2 95 %, unknown if currently breastfeeding. Body mass index is 24.44 kg/m.     07/02/2022    8:38 AM 06/11/2019    2:33 PM 07/27/2017    1:07 AM  Vitals with BMI  Height 5\' 4"     Weight 142 lbs 6 oz    BMI [id number]    Systolic [id number]  Diastolic 78 60 85  Pulse 80 60      Physical Exam Vitals reviewed.  HENT:     Head: Normocephalic and atraumatic.  Cardiovascular:     Rate and Rhythm: Normal rate and regular rhythm.     Pulses: Normal pulses.     Heart sounds: Normal heart sounds. No murmur heard.  No gallop.  Pulmonary:     Effort: Pulmonary effort is normal.     Breath sounds: Normal breath sounds.  Abdominal:     General: Bowel sounds are normal.  Musculoskeletal:     Right lower leg: No edema.     Left lower leg: No edema.  Skin:    General: Skin is warm and dry.  Neurological:     Mental Status: She is alert.     Medications and allergies   Allergies  Allergen Reactions   Nsaids Rash and Other (See Comments)    blisters     Medication list after today's encounter   Current Outpatient Medications:    acetaminophen (TYLENOL) 500 MG tablet, Take 1,000 mg by mouth every 6 (six) hours as needed for moderate pain or headache., Disp: , Rfl:    Probiotic Product (CULTURELLE PROBIOTICS PO), Take  by mouth., Disp: , Rfl:    sertraline (ZOLOFT) 25 MG tablet, Take 25 mg by mouth daily., Disp: , Rfl:   Laboratory examination:   Lab Results  Component Value Date   NA 137 06/11/2019   K 3.5 06/11/2019   CO2 19 (L) 06/11/2019   GLUCOSE 141 (H) 06/11/2019   BUN 10 06/11/2019   CREATININE 0.72 06/11/2019   CALCIUM 9.3 06/11/2019   GFRNONAA >60 06/11/2019       Latest Ref Rng & Units 06/11/2019    2:38 PM  CMP  Glucose 70 - 99 mg/dL 141   BUN 6 - 20 mg/dL 10   Creatinine 0.44 - 1.00 mg/dL 0.72   Sodium 135 - 145 mmol/L 137   Potassium 3.5 - 5.1 mmol/L 3.5   Chloride 98 - 111 mmol/L 105   CO2 22 - 32 mmol/L 19   Calcium 8.9 - 10.3 mg/dL 9.3   Total Protein 6.5 - 8.1 g/dL 7.6   Total Bilirubin 0.3 - 1.2 mg/dL 0.6   Alkaline Phos 38 - 126 U/L 52   AST 15 - 41 U/L 24   ALT 0 - 44 U/L 16       Latest Ref Rng & Units 06/11/2019    2:38 PM 07/20/2017    1:42 PM 09/05/2016    3:56 PM  CBC  WBC 4.0 - 10.5 K/uL 11.6  5.4  9.6   Hemoglobin 12.0 - 15.0 g/dL 14.8  13.8  14.2   Hematocrit 36.0 - 46.0 % 43.4  40.7  40.6   Platelets 150 - 400 K/uL 292  274  230     Lipid Panel No results for input(s): "CHOL", "TRIG", "LDLCALC", "VLDL", "HDL", "CHOLHDL", "LDLDIRECT" in the last 8760 hours.  HEMOGLOBIN A1C No results found for: "HGBA1C", "MPG" TSH No results for input(s): "TSH" in the last 8760 hours.  External labs:     Radiology:    Cardiac Studies:   No results found for this or any previous visit from the past 1095 days.     No results found for this or any previous visit from the past 1095 days.    EKG:   07/02/2022: Sinus Rhythm. Normal axis, normal R wave progression.  -Negative precordial T-waves (V1-2).  Assessment     ICD-10-CM   1. Palpitations  R00.2 EKG 12-Lead       Orders Placed This Encounter  Procedures   EKG 12-Lead    No orders of the defined types were placed in this encounter.   Medications Discontinued During This Encounter   Medication Reason   doxycycline (VIBRAMYCIN) 100  MG capsule Completed Course   Levonorgestrel (SKYLA) 13.5 MG IUD Completed Course   traMADol (ULTRAM) 50 MG tablet Completed Course   promethazine (PHENERGAN) 25 MG suppository Completed Course   Prenatal Vit-Fe Fumarate-FA (MULTIVITAMIN-PRENATAL) 27-0.8 MG TABS tablet Completed Course     Recommendations:   Margaret Powell is a 36 y.o.  female with palpitations  Palpitations Patient admits that she is still taking pre-workout supplement even though she is only taking half dose Discussed limiting pre-workout and other supplements like that since it contributes to palpitations Patient encouraged to stay hydrated and void excessive caffeine intake Will send Propranolol 10mg  to be taken TID PRN for palpitations If symptoms do not improve or become more frequent, will obtain event monitor   Anxiety Patient on Zoloft for this PCP managing  Follow-up in 3 months or sooner if needed    , DO, [facility]  07/02/2022, 9:08 AM Office: [phone] Pager: [phone]

## 2022-08-06 DIAGNOSIS — K317 Polyp of stomach and duodenum: Secondary | ICD-10-CM | POA: Diagnosis not present

## 2022-08-06 DIAGNOSIS — K219 Gastro-esophageal reflux disease without esophagitis: Secondary | ICD-10-CM | POA: Diagnosis not present

## 2022-08-06 DIAGNOSIS — R1013 Epigastric pain: Secondary | ICD-10-CM | POA: Diagnosis not present

## 2022-08-24 DIAGNOSIS — K581 Irritable bowel syndrome with constipation: Secondary | ICD-10-CM | POA: Diagnosis not present

## 2022-08-24 DIAGNOSIS — K317 Polyp of stomach and duodenum: Secondary | ICD-10-CM | POA: Diagnosis not present

## 2022-08-24 DIAGNOSIS — K219 Gastro-esophageal reflux disease without esophagitis: Secondary | ICD-10-CM | POA: Diagnosis not present

## 2022-08-31 DIAGNOSIS — Z0000 Encounter for general adult medical examination without abnormal findings: Secondary | ICD-10-CM | POA: Diagnosis not present

## 2022-09-09 DIAGNOSIS — Z1389 Encounter for screening for other disorder: Secondary | ICD-10-CM | POA: Diagnosis not present

## 2022-09-09 DIAGNOSIS — N930 Postcoital and contact bleeding: Secondary | ICD-10-CM | POA: Diagnosis not present

## 2022-09-09 DIAGNOSIS — Z130 Encounter for screening for diseases of the blood and blood-forming organs and certain disorders involving the immune mechanism: Secondary | ICD-10-CM | POA: Diagnosis not present

## 2022-09-09 DIAGNOSIS — Z01411 Encounter for gynecological examination (general) (routine) with abnormal findings: Secondary | ICD-10-CM | POA: Diagnosis not present

## 2022-10-01 ENCOUNTER — Ambulatory Visit: Payer: BC Managed Care – PPO | Admitting: Internal Medicine

## 2022-11-03 DIAGNOSIS — F4320 Adjustment disorder, unspecified: Secondary | ICD-10-CM | POA: Diagnosis not present

## 2023-06-06 DIAGNOSIS — Z7689 Persons encountering health services in other specified circumstances: Secondary | ICD-10-CM | POA: Diagnosis not present

## 2023-06-06 DIAGNOSIS — F419 Anxiety disorder, unspecified: Secondary | ICD-10-CM | POA: Diagnosis not present

## 2023-06-22 DIAGNOSIS — F419 Anxiety disorder, unspecified: Secondary | ICD-10-CM | POA: Diagnosis not present

## 2023-06-22 DIAGNOSIS — Z7689 Persons encountering health services in other specified circumstances: Secondary | ICD-10-CM | POA: Diagnosis not present

## 2023-06-22 DIAGNOSIS — Z0000 Encounter for general adult medical examination without abnormal findings: Secondary | ICD-10-CM | POA: Diagnosis not present

## 2023-07-04 DIAGNOSIS — L821 Other seborrheic keratosis: Secondary | ICD-10-CM | POA: Diagnosis not present

## 2023-07-04 DIAGNOSIS — R61 Generalized hyperhidrosis: Secondary | ICD-10-CM | POA: Diagnosis not present

## 2023-07-04 DIAGNOSIS — L918 Other hypertrophic disorders of the skin: Secondary | ICD-10-CM | POA: Diagnosis not present
